# Patient Record
Sex: Female | Born: 1970 | Race: Black or African American | Hispanic: No | Marital: Married | Smoking: Current some day smoker
Health system: Southern US, Community
[De-identification: ages and names within clinical notes are randomized; demographics above are authoritative.]

## PROBLEM LIST (undated history)

## (undated) DIAGNOSIS — F439 Reaction to severe stress, unspecified: Secondary | ICD-10-CM

## (undated) HISTORY — PX: HIP SURGERY: SHX245

## (undated) HISTORY — PX: FEMUR SURGERY: SHX943

---

## 2015-03-23 DIAGNOSIS — F411 Generalized anxiety disorder: Secondary | ICD-10-CM | POA: Diagnosis present

## 2015-04-15 ENCOUNTER — Emergency Department (HOSPITAL_COMMUNITY)
Admission: EM | Admit: 2015-04-15 | Discharge: 2015-04-15 | Disposition: A | Discharge status: Home / Self Care | Payer: Medicaid Other | Attending: Emergency Medicine | Admitting: Emergency Medicine

## 2015-04-15 ENCOUNTER — Encounter (HOSPITAL_COMMUNITY): Payer: Self-pay | Admitting: Emergency Medicine

## 2015-04-15 DIAGNOSIS — Z3202 Encounter for pregnancy test, result negative: Secondary | ICD-10-CM | POA: Diagnosis not present

## 2015-04-15 DIAGNOSIS — N390 Urinary tract infection, site not specified: Secondary | ICD-10-CM

## 2015-04-15 DIAGNOSIS — R197 Diarrhea, unspecified: Secondary | ICD-10-CM | POA: Insufficient documentation

## 2015-04-15 DIAGNOSIS — Z79899 Other long term (current) drug therapy: Secondary | ICD-10-CM | POA: Insufficient documentation

## 2015-04-15 DIAGNOSIS — Z792 Long term (current) use of antibiotics: Secondary | ICD-10-CM | POA: Diagnosis not present

## 2015-04-15 DIAGNOSIS — R112 Nausea with vomiting, unspecified: Secondary | ICD-10-CM | POA: Diagnosis not present

## 2015-04-15 DIAGNOSIS — R52 Pain, unspecified: Secondary | ICD-10-CM | POA: Diagnosis present

## 2015-04-15 LAB — URINE MICROSCOPIC-ADD ON

## 2015-04-15 LAB — COMPREHENSIVE METABOLIC PANEL
ALBUMIN: 4.2 g/dL (ref 3.5–5.2)
ALK PHOS: 67 U/L (ref 39–117)
ALT: 33 U/L (ref 0–35)
AST: 42 U/L — AB (ref 0–37)
Anion gap: 9 (ref 5–15)
BILIRUBIN TOTAL: 0.8 mg/dL (ref 0.3–1.2)
BUN: 10 mg/dL (ref 6–23)
CO2: 26 mmol/L (ref 19–32)
Calcium: 9.4 mg/dL (ref 8.4–10.5)
Chloride: 102 mmol/L (ref 96–112)
Creatinine, Ser: 0.69 mg/dL (ref 0.50–1.10)
GFR calc Af Amer: >90 mL/min (ref >90)
GFR calc non Af Amer: >90 mL/min (ref >90)
Glucose, Bld: 99 mg/dL (ref 70–99)
POTASSIUM: 3.9 mmol/L (ref 3.5–5.1)
Sodium: 137 mmol/L (ref 135–145)
Total Protein: 7.9 g/dL (ref 6.0–8.3)

## 2015-04-15 LAB — CBC WITH DIFFERENTIAL/PLATELET
Basophils Absolute: 0 10*3/uL (ref 0.0–0.1)
Basophils Relative: 0 % (ref 0–1)
Eosinophils Absolute: 0.1 10*3/uL (ref 0.0–0.7)
Eosinophils Relative: 2 % (ref 0–5)
HCT: 34.1 % — ABNORMAL LOW (ref 36.0–46.0)
Hemoglobin: 11.2 g/dL — ABNORMAL LOW (ref 12.0–15.0)
LYMPHS PCT: 32 % (ref 12–46)
Lymphs Abs: 2.2 10*3/uL (ref 0.7–4.0)
MCH: 30.1 pg (ref 26.0–34.0)
MCHC: 32.8 g/dL (ref 30.0–36.0)
MCV: 91.7 fL (ref 78.0–100.0)
MONOS PCT: 10 % (ref 3–12)
Monocytes Absolute: 0.7 10*3/uL (ref 0.1–1.0)
NEUTROS ABS: 3.9 10*3/uL (ref 1.7–7.7)
NEUTROS PCT: 56 % (ref 43–77)
Platelets: 262 10*3/uL (ref 150–400)
RBC: 3.72 MIL/uL — ABNORMAL LOW (ref 3.87–5.11)
RDW: 13 % (ref 11.5–15.5)
WBC: 6.9 10*3/uL (ref 4.0–10.5)

## 2015-04-15 LAB — URINALYSIS, ROUTINE W REFLEX MICROSCOPIC
Bilirubin Urine: NEGATIVE
GLUCOSE, UA: NEGATIVE mg/dL
KETONES UR: NEGATIVE mg/dL
Nitrite: POSITIVE — AB
PH: 6 (ref 5.0–8.0)
Protein, ur: NEGATIVE mg/dL
SPECIFIC GRAVITY, URINE: 1.021 (ref 1.005–1.030)
Urobilinogen, UA: 1 mg/dL (ref 0.0–1.0)

## 2015-04-15 LAB — POC URINE PREG, ED: Preg Test, Ur: NEGATIVE

## 2015-04-15 LAB — LIPASE, BLOOD: Lipase: 24 U/L (ref 11–59)

## 2015-04-15 MED ORDER — SODIUM CHLORIDE 0.9 % IV BOLUS (SEPSIS)
1000.0000 mL | Freq: Once | INTRAVENOUS | Status: AC
  Administered 2015-04-15: 1000 mL via INTRAVENOUS

## 2015-04-15 MED ORDER — ONDANSETRON HCL 4 MG PO TABS: 4.0000 mg | ORAL_TABLET | Freq: Four times a day (QID) | ORAL | Status: DC

## 2015-04-15 MED ORDER — CEPHALEXIN 500 MG PO CAPS: 500.0000 mg | ORAL_CAPSULE | Freq: Four times a day (QID) | ORAL | Status: DC

## 2015-04-15 MED ORDER — ONDANSETRON HCL 4 MG/2ML IJ SOLN
4.0000 mg | Freq: Once | INTRAMUSCULAR | Status: AC
  Administered 2015-04-15: 4 mg via INTRAVENOUS
  Filled 2015-04-15: qty 2

## 2015-04-15 MED ORDER — DIPHENOXYLATE-ATROPINE 2.5-0.025 MG PO TABS: 2.0000 | ORAL_TABLET | Freq: Four times a day (QID) | ORAL | Status: DC | PRN

## 2015-04-15 NOTE — ED Notes (Signed)
Per pt- has been having generalized body aches and chills x2 weeks. Has been fatigued. Denies sob or chest pain. Pt alert and oriented x4.

## 2015-04-15 NOTE — ED Provider Notes (Signed)
CSN: [id number]     Arrival date & time 04/15/15  0806 History   First MD Initiated Contact with Patient 04/15/15 [phone]     Chief Complaint  Patient presents with  . Generalized Body Aches     (Consider location/radiation/quality/duration/timing/severity/associated sxs/prior Treatment) HPI Comments: Patient presents to the ER for evaluation of generalized myalgias with abdominal discomfort, nausea, vomiting and diarrhea. Patient reports that symptoms began 3 days ago. She reports that her discomfort worsened today. She has not been able to eat because every time she eats or drinks, she vomits. She has had a slight sore throat, no cough or shortness of breath. She does report chills and sweats, but has not had a fever.   History reviewed. No pertinent past medical history. History reviewed. No pertinent past surgical history. History reviewed. No pertinent family history. History  Substance Use Topics  . Smoking status: Never Smoker   . Smokeless tobacco: Not on file  . Alcohol Use: No   OB History    No data available     Review of Systems  Constitutional: Positive for chills.  Gastrointestinal: Positive for nausea, vomiting, abdominal pain and diarrhea.  All other systems reviewed and are negative.     Allergies  Review of patient's allergies indicates no known allergies.  Home Medications   Prior to Admission medications   Medication Sig Start Date End Date Taking? Authorizing Provider  acetaminophen (TYLENOL) 500 MG tablet Take 1,000 mg by mouth every 6 (six) hours as needed for fever.   Yes Historical Provider, MD  PRESCRIPTION MEDICATION Take 1 tablet by mouth at bedtime. Sleep medciation   Yes Historical Provider, MD  PRESCRIPTION MEDICATION Take 1 tablet by mouth at bedtime. Pain medication   Yes Historical Provider, MD  cephALEXin (KEFLEX) 500 MG capsule Take 1 capsule (500 mg total) by mouth 4 (four) times daily. 04/15/15   Gilda Creasehristopher J Taelyn Broecker, MD   diphenoxylate-atropine (LOMOTIL) 2.5-0.025 MG per tablet Take 2 tablets by mouth 4 (four) times daily as needed for diarrhea or loose stools. 04/15/15   Gilda Creasehristopher J Guerin Lashomb, MD  ondansetron (ZOFRAN) 4 MG tablet Take 1 tablet (4 mg total) by mouth every 6 (six) hours. 04/15/15   Gilda Creasehristopher J Maurya Nethery, MD   BP 104/65 mmHg  Pulse 69  Temp(Src) 98 F (36.7 C) (Oral)  Resp 20  Ht 5\' 6"  (1.676 m)  Wt 160 lb (72.576 kg)  BMI 25.84 kg/m2  SpO2 100%  LMP 03/26/2015 (Approximate) Physical Exam  Constitutional: She is oriented to person, place, and time. She appears well-developed and well-nourished. No distress.  HENT:  Head: Normocephalic and atraumatic.  Right Ear: Hearing normal.  Left Ear: Hearing normal.  Nose: Nose normal.  Mouth/Throat: Oropharynx is clear and moist and mucous membranes are normal.  Eyes: Conjunctivae and EOM are normal. Pupils are equal, round, and reactive to light.  Neck: Normal range of motion. Neck supple.  Cardiovascular: Regular rhythm, S1 normal and S2 normal.  Exam reveals no gallop and no friction rub.   No murmur heard. Pulmonary/Chest: Effort normal and breath sounds normal. No respiratory distress. She exhibits no tenderness.  Abdominal: Soft. Normal appearance and bowel sounds are normal. There is no hepatosplenomegaly. There is no tenderness. There is no rebound, no guarding, no tenderness at McBurney's point and negative Murphy's sign. No hernia.  Musculoskeletal: Normal range of motion.  Neurological: She is alert and oriented to person, place, and time. She has normal strength. No cranial nerve deficit or sensory  deficit. Coordination normal. GCS eye subscore is 4. GCS verbal subscore is 5. GCS motor subscore is 6.  Skin: Skin is warm, dry and intact. No rash noted. No cyanosis.  Psychiatric: She has a normal mood and affect. Her speech is normal and behavior is normal. Thought content normal.  Nursing note and vitals reviewed.   ED Course   Procedures (including critical care time) Labs Review Labs Reviewed  CBC WITH DIFFERENTIAL/PLATELET - Abnormal; Notable for the following:    RBC 3.72 (*)    Hemoglobin 11.2 (*)    HCT 34.1 (*)    All other components within normal limits  COMPREHENSIVE METABOLIC PANEL - Abnormal; Notable for the following:    AST 42 (*)    All other components within normal limits  URINALYSIS, ROUTINE W REFLEX MICROSCOPIC - Abnormal; Notable for the following:    Color, Urine AMBER (*)    APPearance CLOUDY (*)    Hgb urine dipstick SMALL (*)    Nitrite POSITIVE (*)    Leukocytes, UA SMALL (*)    All other components within normal limits  URINE MICROSCOPIC-ADD ON - Abnormal; Notable for the following:    Squamous Epithelial / LPF FEW (*)    Bacteria, UA MANY (*)    All other components within normal limits  LIPASE, BLOOD  POC URINE PREG, ED    Imaging Review No results found.   EKG Interpretation None      MDM   Final diagnoses:  UTI (lower urinary tract infection)  Nausea and vomiting, vomiting of unspecified type  Diarrhea    Patient presents to the ER for evaluation of generalized body aches. She reports feeling fatigued and all of her muscles hurting. She has had nausea, vomiting and diarrhea associated with the symptoms. Abdominal exam was benign. Vital signs are normal. Lab work was normal, other than signs of urinary tract infection on urinalysis. Patient was hydrated here in the ER. She is appropriate for outpatient management. Symptomatically treatment of vomiting and diarrhea with Lomotil and Zofran. She was prescribed Keflex for urinary tract infection.    Gilda Crease, MD 04/17/15 [phone]

## 2015-04-15 NOTE — Discharge Instructions (Signed)
Diarrhea °Diarrhea is watery poop (stool). It can make you feel weak, tired, thirsty, or give you a dry mouth (signs of dehydration). Watery poop is a sign of another problem, most often an infection. It often lasts 2-3 days. It can last longer if it is a sign of something serious. Take care of yourself as told by your doctor. °HOME CARE  °· Drink 1 cup (8 ounces) of fluid each time you have watery poop. °· Do not drink the following fluids: °· Those that contain simple sugars (fructose, glucose, galactose, lactose, sucrose, maltose). °· Sports drinks. °· Fruit juices. °· Whole milk products. °· Sodas. °· Drinks with caffeine (coffee, tea, soda) or alcohol. °· Oral rehydration solution may be used if the doctor says it is okay. You may make your own solution. Follow this recipe: °·  - teaspoon table salt. °· ¾ teaspoon baking soda. °·  teaspoon salt substitute containing potassium chloride. °· 1 tablespoons sugar. °· 1 liter (34 ounces) of water. °· Avoid the following foods: °· High fiber foods, such as raw fruits and vegetables. °· Nuts, seeds, and whole grain breads and cereals. °·  Those that are sweetened with sugar alcohols (xylitol, sorbitol, mannitol). °· Try eating the following foods: °· Starchy foods, such as rice, toast, pasta, low-sugar cereal, oatmeal, baked potatoes, crackers, and bagels. °· Bananas. °· Applesauce. °· Eat probiotic-rich foods, such as yogurt and milk products that are fermented. °· Wash your hands well after each time you have watery poop. °· Only take medicine as told by your doctor. °· Take a warm bath to help lessen burning or pain from having watery poop. °GET HELP RIGHT AWAY IF:  °· You cannot drink fluids without throwing up (vomiting). °· You keep throwing up. °· You have blood in your poop, or your poop looks black and tarry. °· You do not pee (urinate) in 6-8 hours, or there is only a small amount of very dark pee. °· You have belly (abdominal) pain that gets worse or stays  in the same spot (localizes). °· You are weak, dizzy, confused, or light-headed. °· You have a very bad headache. °· Your watery poop gets worse or does not get better. °· You have a fever or lasting symptoms for more than 2-3 days. °· You have a fever and your symptoms suddenly get worse. °MAKE SURE YOU:  °· Understand these instructions. °· Will watch your condition. °· Will get help right away if you are not doing well or get worse. °Document Released: 05/28/2008 Document Revised: 04/26/2014 Document Reviewed: 08/17/2012 °[employer]® Patient Information ©2015 [employer]. This information is not intended to replace advice given to you by your health care provider. Make sure you discuss any questions you have with your health care provider. ° °Nausea and Vomiting °Nausea means you feel sick to your stomach. Throwing up (vomiting) is a reflex where stomach contents come out of your mouth. °HOME CARE  °· Take medicine as told by your doctor. °· Do not force yourself to eat. However, you do need to drink fluids. °· If you feel like eating, eat a normal diet as told by your doctor. °· Eat rice, wheat, potatoes, bread, lean meats, yogurt, fruits, and vegetables. °· Avoid high-fat foods. °· Drink enough fluids to keep your pee (urine) clear or pale yellow. °· Ask your doctor how to replace body fluid losses (rehydrate). Signs of body fluid loss (dehydration) include: °· Feeling very thirsty. °· Dry lips and mouth. °· Feeling dizzy. °·   Dark pee. °· Peeing less than normal. °· Feeling confused. °· Fast breathing or heart rate. °GET HELP RIGHT AWAY IF:  °· You have blood in your throw up. °· You have black or bloody poop (stool). °· You have a bad headache or stiff neck. °· You feel confused. °· You have bad belly (abdominal) pain. °· You have chest pain or trouble breathing. °· You do not pee at least once every 8 hours. °· You have cold, clammy skin. °· You keep throwing up after 24 to 48 hours. °· You have a  fever. °MAKE SURE YOU:  °· Understand these instructions. °· Will watch your condition. °· Will get help right away if you are not doing well or get worse. °Document Released: 05/28/2008 Document Revised: 03/03/2012 Document Reviewed: 05/11/2011 °[employer]® Patient Information ©2015 [employer]. This information is not intended to replace advice given to you by your health care provider. Make sure you discuss any questions you have with your health care provider. ° °Urinary Tract Infection °A urinary tract infection (UTI) can occur any place along the urinary tract. The tract includes the kidneys, ureters, bladder, and urethra. A type of germ called bacteria often causes a UTI. UTIs are often helped with antibiotic medicine.  °HOME CARE  °· If given, take antibiotics as told by your doctor. Finish them even if you start to feel better. °· Drink enough fluids to keep your pee (urine) clear or pale yellow. °· Avoid tea, drinks with caffeine, and bubbly (carbonated) drinks. °· Pee often. Avoid holding your pee in for a long time. °· Pee before and after having sex (intercourse). °· Wipe from front to back after you poop (bowel movement) if you are a woman. Use each tissue only once. °GET HELP RIGHT AWAY IF:  °· You have back pain. °· You have lower belly (abdominal) pain. °· You have chills. °· You feel sick to your stomach (nauseous). °· You throw up (vomit). °· Your burning or discomfort with peeing does not go away. °· You have a fever. °· Your symptoms are not better in 3 days. °MAKE SURE YOU:  °· Understand these instructions. °· Will watch your condition. °· Will get help right away if you are not doing well or get worse. °Document Released: 05/28/2008 Document Revised: 09/03/2012 Document Reviewed: 07/10/2012 °[employer]® Patient Information ©2015 [employer]. This information is not intended to replace advice given to you by your health care provider. Make sure you discuss any questions you have with your  health care provider. ° °

## 2015-05-23 ENCOUNTER — Emergency Department (HOSPITAL_COMMUNITY)
Admission: EM | Admit: 2015-05-23 | Discharge: 2015-05-23 | Disposition: A | Discharge status: Home / Self Care | Payer: Medicaid Other | Attending: Emergency Medicine | Admitting: Emergency Medicine

## 2015-05-23 ENCOUNTER — Encounter (HOSPITAL_COMMUNITY): Payer: Self-pay | Admitting: *Deleted

## 2015-05-23 DIAGNOSIS — Z720 Tobacco use: Secondary | ICD-10-CM | POA: Insufficient documentation

## 2015-05-23 DIAGNOSIS — K029 Dental caries, unspecified: Secondary | ICD-10-CM | POA: Insufficient documentation

## 2015-05-23 DIAGNOSIS — Z792 Long term (current) use of antibiotics: Secondary | ICD-10-CM | POA: Insufficient documentation

## 2015-05-23 DIAGNOSIS — K0889 Other specified disorders of teeth and supporting structures: Secondary | ICD-10-CM

## 2015-05-23 DIAGNOSIS — K088 Other specified disorders of teeth and supporting structures: Secondary | ICD-10-CM | POA: Insufficient documentation

## 2015-05-23 DIAGNOSIS — F439 Reaction to severe stress, unspecified: Secondary | ICD-10-CM | POA: Insufficient documentation

## 2015-05-23 DIAGNOSIS — Z79899 Other long term (current) drug therapy: Secondary | ICD-10-CM | POA: Diagnosis not present

## 2015-05-23 HISTORY — DX: Reaction to severe stress, unspecified: F43.9

## 2015-05-23 MED ORDER — HYDROCODONE-ACETAMINOPHEN 5-325 MG PO TABS: 1.0000 | ORAL_TABLET | Freq: Four times a day (QID) | ORAL | Status: DC | PRN

## 2015-05-23 MED ORDER — IBUPROFEN 800 MG PO TABS: 800.0000 mg | ORAL_TABLET | Freq: Three times a day (TID) | ORAL | Status: DC | PRN

## 2015-05-23 MED ORDER — PENICILLIN V POTASSIUM 500 MG PO TABS: 500.0000 mg | ORAL_TABLET | Freq: Four times a day (QID) | ORAL | Status: AC

## 2015-05-23 NOTE — Discharge Instructions (Signed)
Read the information below.  Use the prescribed medication as directed.  Please discuss all new medications with your pharmacist.  Do not take additional tylenol while taking the prescribed pain medication to avoid overdose.  You may return to the Emergency Department at any time for worsening condition or any new symptoms that concern you.    If there is any possibility that you might be pregnant, please let your health care provider know and discuss this with the pharmacist to ensure medication safety.  Please call the dentist to schedule a close follow up appointment.  If you develop fevers, swelling in your face, difficulty swallowing or breathing, return to the ER immediately for a recheck.    Dental Pain A tooth ache may be caused by cavities (tooth decay). Cavities expose the nerve of the tooth to air and hot or cold temperatures. It may come from an infection or abscess (also called a boil or furuncle) around your tooth. It is also often caused by dental caries (tooth decay). This causes the pain you are having. DIAGNOSIS  Your caregiver can diagnose this problem by exam. TREATMENT   If caused by an infection, it may be treated with medications which kill germs (antibiotics) and pain medications as prescribed by your caregiver. Take medications as directed.  Only take over-the-counter or prescription medicines for pain, discomfort, or fever as directed by your caregiver.  Whether the tooth ache today is caused by infection or dental disease, you should see your dentist as soon as possible for further care. SEEK MEDICAL CARE IF: The exam and treatment you received today has been provided on an emergency basis only. This is not a substitute for complete medical or dental care. If your problem worsens or new problems (symptoms) appear, and you are unable to meet with your dentist, call or return to this location. SEEK IMMEDIATE MEDICAL CARE IF:   You have a fever.  You develop redness and  swelling of your face, jaw, or neck.  You are unable to open your mouth.  You have severe pain uncontrolled by pain medicine. MAKE SURE YOU:   Understand these instructions.  Will watch your condition.  Will get help right away if you are not doing well or get worse. Document Released: 12/10/2005 Document Revised: 03/03/2012 Document Reviewed: 07/28/2008 [facility] Patient Information 2015 [place], [place]. This information is not intended to replace advice given to you by your health care provider. Make sure you discuss any questions you have with your health care provider.    Emergency Department Resource Guide 1) Find a Doctor and Pay Out of Pocket Although you won't have to find out who is covered by your insurance plan, it is a good idea to ask around and get recommendations. You will then need to call the office and see if the doctor you have chosen will accept you as a new patient and what types of options they offer for patients who are self-pay. Some doctors offer discounts or will set up payment plans for their patients who do not have insurance, but you will need to ask so you aren't surprised when you get to your appointment.  2) Contact Your Local Health Department Not all health departments have doctors that can see patients for sick visits, but many do, so it is worth a call to see if yours does. If you don't know where your local health department is, you can check in your phone book. The CDC also has a tool to help  locate your state's health department, and many state websites also have listings of all of their local health departments. ° °3) Find a Walk-in Clinic °If your illness is not likely to be very severe or complicated, you may want to try a walk in clinic. These are popping up all over the country in pharmacies, drugstores, and shopping centers. They're usually staffed by nurse practitioners or physician assistants that have been trained to treat common illnesses and complaints.  They're usually fairly quick and inexpensive. However, if you have serious medical issues or chronic medical problems, these are probably not your best option. ° °No Primary Care Doctor: °- Call Health Connect at  832-8000 - they can help you locate a primary care doctor that  accepts your insurance, provides certain services, etc. °- Physician Referral Service- 1-800-533-3463 ° °Chronic Pain Problems: °Organization         Address  Phone   Notes  °[facility]  (336) 297-2271 Patients need to be referred by their primary care doctor.  ° °Medication Assistance: °Organization         Address  Phone   Notes  °[place] [facility] [address] °[place], [place] [address] (336) 641-8030 --Must be a resident of [place] °-- Must have NO insurance coverage whatsoever (no Medicaid/ Medicare, etc.) °-- The pt. MUST have a primary care doctor that directs their care regularly and follows them in the community °  °[facility]  (866) 331-1348   °United Way  (888) 892-1162   ° °Agencies that provide inexpensive medical care: °Organization         Address  Phone   Notes  °[facility]  (336) 832-8035   °[facility]    (336) 832-7272   °[facility] [address] °[place], [place] [address] (336) 832-4777   °[facility] [address], °[place] (336) 271-4999   °[facility]    (336) 373-0678   °[facility]    (336) 272-1050   °[facility] ° [address], [place] Phone:  (336) 832-4444, Fax:  (336) 832-4440 Hours of Operation:  9 am - 6 pm, M-F.  Also accepts Medicaid/Medicare and self-pay.  ° [facility] ° [address], [place] Phone: (336) 832-3150, Fax: (336) 832-3151. Hours of Operation:  8:30 am - 5:30 pm, M-F.  Also accepts Medicaid and self-pay.  °[facility] [place] [address], [place]  Phone: (336) 878-6027   °[facility] [address], [place], [place] (336)723-1848, Ext. 123 Mondays & Thursdays: 7-9 AM.  First 15 patients are seen on a first come, first serve basis. °  ° °Medicaid-accepting [place] Providers: ° °Organization         Address  Phone   Notes  °[facility] [address], [place] (336) 641-2100 Also accepts self-pay patients.  °[facility] [address], [place] ° (336) 856-9996   °[facility] [address], [place] (336) 288-8857   °[facility] [address] [place] [address], [place] (336) 299-7000   °Veita Bland [address], [place]  ° (336) 373-1557 Only accepts [place] Access Medicaid patients after they have their name applied to their card.  ° °Self-Pay (no insurance) in [place]: ° °Organization           Address  Phone   Notes  Sickle Cell Patients, [facility] [address], [place] [phone]) [phone]   [facility] [address], [place] [phone]) [phone]   [facility] [place]  [address], [place] [phone]) [phone]   [facility]/Dr. Osei-Bonsu  [address] [place] [address], [place] or [phone], [place] [phone]) [phone] Phone number for both [place] and [place] locations is the same.  [facility] [address], [place] [phone]   [facility] [address] [place] [address], [place] or [address] [phone]   [facility] [address], [place] [phone], phone; [phone], fax Sees patients 1st and 3rd Saturday of every month.  Must not qualify for public or private insurance (i.e. Medicaid, Medicare, [place] Health Choice, Veterans' Benefits)  Household income should be no more than 200% of the poverty level  The clinic cannot treat you if you are pregnant or think you are pregnant  Sexually transmitted diseases are not treated at the clinic.    Dental Care: Organization         Address  Phone  Notes  [facility] [address], [place] [phone] Accepts children up to age 44 who are enrolled in [place] or [place] Health Choice; pregnant women with a Medicaid card; and children who have applied for Medicaid or [place] Health Choice, but were declined, whose parents can pay a reduced fee at time of service.  [facility] [place]  [address], [place] [phone](336) [phone] Accepts children up to age 44 who are enrolled in [place] or  Health Choice; pregnant women with a Medicaid card; and children who have applied for Medicaid or  Health Choice, but were declined, whose parents can pay a reduced fee at time of service.  [facility]  [address], [place] [phone]) [phone] Patients are seen by appointment only. Walk-ins are not accepted. [facility] will see patients 44 years of age and older. Monday - Tuesday (8am-5pm) Most Wednesdays (8:30-5pm) $30 per visit, cash only  [facility]  [address], [facility] [phone]) [phone] Patients are seen by appointment only. Walk-ins are not accepted. [facility] will see patients 44 years of age and older. One Wednesday Evening (Monthly: Volunteer Based).  $30 per visit, cash only  [facility]  [phone] for adults; Children under age 294, call Graduate Pediatric Dentistry at [phone]. Children aged 704-14, please call [phone] to request a pediatric application.  Dental services are provided in all areas of dental care including fillings, crowns and bridges, complete and partial dentures, implants, gum treatment, root canals, and extractions. Preventive care is  also provided. Treatment is provided to both adults and children. Patients are selected via a lottery and there is often a waiting list.   [facility] [address], [place]  [phone] [email or web address]   [facility] [address], [place], [place] [phone], Ext. [phone] Second and Fourth Thursday of each month, opens at 6:30 AM; Clinic ends at 9 AM.  Patients are seen on a first-come first-served basis, and a limited number are seen during each clinic.   [facility]  [address], [place], [place] [phone]) [phone]   Eligibility Requirements °You must have lived in [place], [place], or [place] counties for at least the last three months. °  You cannot be eligible for state or federal sponsored healthcare insurance, including Veterans Administration, Medicaid, or Medicare. °  You generally cannot be eligible for healthcare insurance through your employer.  °  How to apply: °Eligibility screenings are held every Tuesday and Wednesday afternoon from 1:00 pm until 4:00 pm. You do not need an appointment for the interview!  °[facility] [address], [place], [place] [phone]   °[place] [facility]  336-342-8273   °[facility]  336-703-3100   °[facility]  336-570-6415   ° °Behavioral Health Resources in the Community: °Intensive Outpatient Programs °Organization         Address  Phone  Notes  °[facility] [place] [facility] [address], [place], [place] 336-878-6098   °[facility] [address], [place], [place] 336-832-9800   °[facility] [address], [place], [place] ° 336-882-2125   °[facility] [address],  °[place], [place] 1-800-853-5163 or 336-641-4981   °Substance Abuse Resources °Organization         Address  Phone  Notes  °[facility]  336-882-2125   °[facility]   336-784-9470   °[facility]  336-285-9073   °[facility]  336-845-3988   °Residential & Outpatient Substance Abuse Program  1-800-659-3381   °Psychological Services °Organization         Address  Phone  Notes  ° Health  336- 832-9600   °[facility]  336- 378-7881   °[facility] [address], [place] 1-800-853-5163 or 336-641-4981   ° °Mobile Crisis Teams °Organization         Address  Phone  Notes  °[facility], Mobile Crisis Care Unit  1-877-626-1772   °Assertive °[facility] ° [address] [place], [place] 336-834-9664   °Sharon DeEsch [address] °[place] 336-554-5454   ° °Self-Help/Support Groups °Organization         Address  Phone             Notes  °[facility] [place] - variety of support groups  336- 373-1402 Call for more information  °Narcotics Anonymous (NA), [facility] [address], °[place]  2 meetings at this location  ° °Residential Treatment Programs °Organization         Address  Phone  Notes  °[facility] [address],    °[place]  1-866-801-8205   °[facility] ° [address], [place], [place] 704-293-8524   °[facility] [address], [place] 336-845-3988 Admissions: 8am-3pm M-F  °[facility] [address]    °[place], [place] 336-841-1104   °[facility] [address], [place], [place] 336-379-7146   °[facility] [address]  °[place], [place] 336-285-9073   °[facility] Intensive Outpatient [address], [place], [place] 336-852-3033   °[facility] [address]  °[place], [place] 1-877-615-2722 or 336-784-9470   °[facility] ([facility]) [address] [place], [place] 336-227-7417 Accepts Medicaid  °[facility] [address]  °[place]  1-800-659-3381 Substance  Abuse/Addiction Treatment  ° °[place] Behavioral Health Resources °Organization           Address  Phone  Notes  °[facility]  (888) 581-9988   °Julie Brannon, PhD [address] [place], [place]   (336) 349-5553 or (336) 951-0000   °[facility]   [address] °[place], [place] (336) 349-4454   °[facility] [address], [place], [place] (336) 342-8316 Insurance/Medicaid/sponsorship through [facility]  °[facility] [address]                                    [place], [place] (336) 342-8316 Therapy/tele-psych/case  °[facility] [address]  ° [place], [place] (336) 349-2233    °Dr. Arfeen  (336) 349-4544   °[facility] [place]  [employer] [place] [facility] 1) [address], [place] °2) [address], [place] °3)  [address], [place] (336) 349-3220 °(336) 342-7768 ° °(336) 342-8140   °[place] Child Abuse Hotline (336) 342-1394 or (336) 342-3537 (After Hours)    ° ° ° °

## 2015-05-23 NOTE — ED Notes (Signed)
Pt reports dental pain due broken tooth. Pt needs a referral to dentist.

## 2015-05-23 NOTE — ED Provider Notes (Signed)
CSN: [id number]     Arrival date & time 05/23/15  1026 History  This chart was scribed for non-physician practitioner, Trixie Dredge, PA-C, working with Mancel Bale, MD by Charline Bills, ED Scribe. This patient was seen in room TR06C/TR06C and the patient's care was started at 10:52 AM.   Chief Complaint  Patient presents with  . Dental Pain   The history is provided by the patient. No language interpreter was used.   HPI Comments: Erica Wiggins is a 44 y.o. female who presents to the Emergency Department complaining of constant, moderate right upper dental pain for the past 3 days. Pt states that she was eating rock candy 3 days ago when she chewed on a portion of her tooth. Pt describes her pain as a throbbing sensation that is exacerbated with eating, drinking and cool air. She has been treating with extra strength Tylenol and topical ointment without relief. She denies fever, chills, sore throat. Pt is in the [employer] and recently moved here after residing in [place] and [place]; she does not have a Radiation protection practitioner.   Past Medical History  Diagnosis Date  . Stress    Past Surgical History  Procedure Laterality Date  . Hip surgery      occured while in service  . Femur surgery      while in the service   History reviewed. No pertinent family history. History  Substance Use Topics  . Smoking status: Current Some Day Smoker    Types: Cigarettes  . Smokeless tobacco: Never Used  . Alcohol Use: No   OB History    No data available     Review of Systems  Constitutional: Negative for fever and chills.  HENT: Positive for dental problem. Negative for sore throat.    Allergies  Review of patient's allergies indicates no known allergies.  Home Medications   Prior to Admission medications   Medication Sig Start Date End Date Taking? Authorizing Provider  acetaminophen (TYLENOL) 500 MG tablet Take 1,000 mg by mouth every 6 (six) hours as needed for fever.    Historical Provider, MD   cephALEXin (KEFLEX) 500 MG capsule Take 1 capsule (500 mg total) by mouth 4 (four) times daily. 04/15/15   Gilda Crease, MD  diphenoxylate-atropine (LOMOTIL) 2.5-0.025 MG per tablet Take 2 tablets by mouth 4 (four) times daily as needed for diarrhea or loose stools. 04/15/15   Gilda Crease, MD  ondansetron (ZOFRAN) 4 MG tablet Take 1 tablet (4 mg total) by mouth every 6 (six) hours. 04/15/15   Gilda Crease, MD  PRESCRIPTION MEDICATION Take 1 tablet by mouth at bedtime. Sleep medciation    Historical Provider, MD  PRESCRIPTION MEDICATION Take 1 tablet by mouth at bedtime. Pain medication    Historical Provider, MD   Triage: BP 108/72 mmHg  Pulse 88  Temp(Src) 98.7 F (37.1 C) (Oral)  Resp 16  SpO2 100%  LMP 04/26/2015 Physical Exam  Constitutional: She appears well-developed and well-nourished. No distress.  HENT:  Head: Normocephalic and atraumatic.  Mouth/Throat: Uvula is midline and oropharynx is clear and moist. Mucous membranes are not dry. Dental caries present. No uvula swelling. No oropharyngeal exudate, posterior oropharyngeal edema, posterior oropharyngeal erythema or tonsillar abscesses.  Right upper first molar with large cavity with decay and broken metal filling.  No edema, erythema, fluctuance, facial swelling.    Neck: Trachea normal, normal range of motion and phonation normal. Neck supple. No tracheal tenderness present. No rigidity. No tracheal deviation,  no edema, no erythema and normal range of motion present.  Cardiovascular: Normal rate.   Pulmonary/Chest: Effort normal and breath sounds normal. No stridor.  Lymphadenopathy:    She has no cervical adenopathy.  Neurological: She is alert.  Skin: She is not diaphoretic.  Nursing note and vitals reviewed.  ED Course  Procedures (including critical care time) DIAGNOSTIC STUDIES: Oxygen Saturation is 100% on RA, normal by my interpretation.    COORDINATION OF CARE: 10:57 AM-Discussed  treatment plan which includes 800 mg ibuprofen, Norco, Veetid and follow-up with dentist with pt at bedside and pt agreed to plan.   Labs Review Labs Reviewed - No data to display  Imaging Review No results found.   EKG Interpretation None      MDM   Final diagnoses:  Pain, dental    Afebrile, nontoxic patient with new dental pain.  Large cavity with decay in previously filled molar. No obvious abscess.  No concerning findings on exam.  Doubt deep space head or neck infection.  Doubt Ludwig's angina.  D/C home with antibiotic, pain medication and dental follow up.  Discussed findings, treatment, and follow up  with patient.  Pt given return precautions.  Pt verbalizes understanding and agrees with plan.       I personally performed the services described in this documentation, which was scribed in my presence. The recorded information has been reviewed and is accurate.    Trixie Dredgemily Kirstin Kugler, PA-C 05/23/15 1427  Mancel BaleElliott [place], MD 05/23/15 [phone]

## 2015-05-28 ENCOUNTER — Emergency Department (HOSPITAL_COMMUNITY)
Admission: EM | Admit: 2015-05-28 | Discharge: 2015-05-28 | Disposition: A | Discharge status: Home / Self Care | Payer: Medicaid Other | Attending: Emergency Medicine | Admitting: Emergency Medicine

## 2015-05-28 ENCOUNTER — Encounter (HOSPITAL_COMMUNITY): Payer: Self-pay | Admitting: *Deleted

## 2015-05-28 DIAGNOSIS — Z720 Tobacco use: Secondary | ICD-10-CM | POA: Diagnosis not present

## 2015-05-28 DIAGNOSIS — R1110 Vomiting, unspecified: Secondary | ICD-10-CM | POA: Insufficient documentation

## 2015-05-28 DIAGNOSIS — Z792 Long term (current) use of antibiotics: Secondary | ICD-10-CM | POA: Insufficient documentation

## 2015-05-28 DIAGNOSIS — Z79899 Other long term (current) drug therapy: Secondary | ICD-10-CM | POA: Insufficient documentation

## 2015-05-28 DIAGNOSIS — J029 Acute pharyngitis, unspecified: Secondary | ICD-10-CM

## 2015-05-28 LAB — COMPREHENSIVE METABOLIC PANEL
ALBUMIN: 4.2 g/dL (ref 3.5–5.0)
ALK PHOS: 94 U/L (ref 38–126)
ALT: 68 U/L — ABNORMAL HIGH (ref 14–54)
AST: 86 U/L — AB (ref 15–41)
Anion gap: 15 (ref 5–15)
BUN: 14 mg/dL (ref 6–20)
CO2: 19 mmol/L — ABNORMAL LOW (ref 22–32)
CREATININE: 0.82 mg/dL (ref 0.44–1.00)
Calcium: 9.2 mg/dL (ref 8.9–10.3)
Chloride: 103 mmol/L (ref 101–111)
GFR calc Af Amer: >60 mL/min (ref >60)
GFR calc non Af Amer: >60 mL/min (ref >60)
Glucose, Bld: 116 mg/dL — ABNORMAL HIGH (ref 65–99)
Potassium: 3.7 mmol/L (ref 3.5–5.1)
SODIUM: 137 mmol/L (ref 135–145)
Total Bilirubin: 0.7 mg/dL (ref 0.3–1.2)
Total Protein: 7.9 g/dL (ref 6.5–8.1)

## 2015-05-28 LAB — CBC WITH DIFFERENTIAL/PLATELET
Basophils Absolute: 0 10*3/uL (ref 0.0–0.1)
Basophils Relative: 0 % (ref 0–1)
EOS PCT: 1 % (ref 0–5)
Eosinophils Absolute: 0.1 10*3/uL (ref 0.0–0.7)
HCT: 32.1 % — ABNORMAL LOW (ref 36.0–46.0)
Hemoglobin: 10.8 g/dL — ABNORMAL LOW (ref 12.0–15.0)
Lymphocytes Relative: 29 % (ref 12–46)
Lymphs Abs: 2.8 10*3/uL (ref 0.7–4.0)
MCH: 30.2 pg (ref 26.0–34.0)
MCHC: 33.6 g/dL (ref 30.0–36.0)
MCV: 89.7 fL (ref 78.0–100.0)
MONOS PCT: 5 % (ref 3–12)
Monocytes Absolute: 0.5 10*3/uL (ref 0.1–1.0)
NEUTROS ABS: 6.3 10*3/uL (ref 1.7–7.7)
Neutrophils Relative %: 65 % (ref 43–77)
Platelets: 264 10*3/uL (ref 150–400)
RBC: 3.58 MIL/uL — AB (ref 3.87–5.11)
RDW: 13.7 % (ref 11.5–15.5)
WBC: 9.8 10*3/uL (ref 4.0–10.5)

## 2015-05-28 LAB — LIPASE, BLOOD: LIPASE: 18 U/L — AB (ref 22–51)

## 2015-05-28 MED ORDER — IBUPROFEN 600 MG PO TABS: 600.0000 mg | ORAL_TABLET | Freq: Four times a day (QID) | ORAL | Status: DC | PRN

## 2015-05-28 NOTE — ED Provider Notes (Signed)
CSN: [id number]     Arrival date & time 05/28/15  1515 History   First MD Initiated Contact with Patient 05/28/15 1652     Chief Complaint  Patient presents with  . Sore Throat  . Emesis     (Consider location/radiation/quality/duration/timing/severity/associated sxs/prior Treatment) HPI Erica Wiggins is a 44 y.o. female who comes in for evaluation of sore throat, nausea and vomiting. Patient states she was recently seen a few days ago in the ED for a toothache. She was prescribed antibiotic's and has been taking this medication without missing a dose. Reports she has 3 days left of this medication. She reports onset of sore throat today with some nausea. She reports eating a brownie, cheese and a hamburger at lunch and proceeded to vomit. She reports feeling hot and cold at home but denies any measurable fever. Denies difficulties breathing, eating or drinking. She is tried Tylenol without relief of her symptoms. Rates her pain as a 9/10. No other aggravating or modifying factors.  Past Medical History  Diagnosis Date  . Stress    Past Surgical History  Procedure Laterality Date  . Hip surgery      occured while in service  . Femur surgery      while in the service   History reviewed. No pertinent family history. History  Substance Use Topics  . Smoking status: Current Some Day Smoker    Types: Cigarettes  . Smokeless tobacco: Never Used  . Alcohol Use: No   OB History    No data available     Review of Systems A 10 point review of systems was completed and was negative except for pertinent positives and negatives as mentioned in the history of present illness     Allergies  Review of patient's allergies indicates no known allergies.  Home Medications   Prior to Admission medications   Medication Sig Start Date End Date Taking? Authorizing Provider  acetaminophen (TYLENOL) 500 MG tablet Take 1,000 mg by mouth every 6 (six) hours as needed for fever.   Yes Historical  Provider, MD  diphenoxylate-atropine (LOMOTIL) 2.5-0.025 MG per tablet Take 2 tablets by mouth 4 (four) times daily as needed for diarrhea or loose stools. 04/15/15  Yes Gilda Creasehristopher J Pollina, MD  ferrous sulfate 325 (65 FE) MG tablet Take 325 mg by mouth daily. 02/21/15 02/21/16 Yes Historical Provider, MD  penicillin v potassium (VEETID) 500 MG tablet Take 1 tablet (500 mg total) by mouth 4 (four) times daily. 05/23/15 05/30/15 Yes Trixie DredgeEmily West, PA-C  cephALEXin (KEFLEX) 500 MG capsule Take 1 capsule (500 mg total) by mouth 4 (four) times daily. 04/15/15   Gilda Creasehristopher J Pollina, MD  HYDROcodone-acetaminophen (NORCO/VICODIN) 5-325 MG per tablet Take 1 tablet by mouth every 6 (six) hours as needed for severe pain. 05/23/15   Trixie DredgeEmily West, PA-C  ibuprofen (ADVIL,MOTRIN) 600 MG tablet Take 1 tablet (600 mg total) by mouth every 6 (six) hours as needed. 05/28/15   Joycie PeekBenjamin Marvel Sapp, PA-C  ondansetron (ZOFRAN) 4 MG tablet Take 1 tablet (4 mg total) by mouth every 6 (six) hours. 04/15/15   Gilda Creasehristopher J Pollina, MD  PRESCRIPTION MEDICATION Take 1 tablet by mouth at bedtime. Sleep medciation    Historical Provider, MD  PRESCRIPTION MEDICATION Take 1 tablet by mouth at bedtime. Pain medication    Historical Provider, MD   BP 115/76 mmHg  Pulse 91  Temp(Src) 98.8 F (37.1 C) (Oral)  Resp 18  Ht 5\' 5"  (1.651 m)  Wt 160 lb (  72.576 kg)  BMI 26.63 kg/m2  SpO2 96%  LMP 05/25/2015 Physical Exam  Constitutional: She is oriented to person, place, and time. She appears well-developed and well-nourished.  HENT:  Head: Normocephalic and atraumatic.  Mouth/Throat: Oropharynx is clear and moist.  Small exudates on bilateral tonsils. No unilateral tonsillar swelling. Patent airway. No trismus or glossal elevation. Tolerating secretions well.  Eyes: Conjunctivae are normal. Pupils are equal, round, and reactive to light. Right eye exhibits no discharge. Left eye exhibits no discharge. No scleral icterus.  Neck: Normal range of  motion. Neck supple.  Very mild left-sided cervical adenopathy  Cardiovascular: Normal rate, regular rhythm and normal heart sounds.   Pulmonary/Chest: Effort normal and breath sounds normal. No respiratory distress. She has no wheezes. She has no rales.  Abdominal: Soft. There is no tenderness.  Musculoskeletal: She exhibits no tenderness.  Neurological: She is alert and oriented to person, place, and time.  Cranial Nerves II-XII grossly intact  Skin: Skin is warm and dry. No rash noted.  Psychiatric: She has a normal mood and affect.  Nursing note and vitals reviewed.   ED Course  Procedures (including critical care time) Labs Review Labs Reviewed  CBC WITH DIFFERENTIAL/PLATELET - Abnormal; Notable for the following:    RBC 3.58 (*)    Hemoglobin 10.8 (*)    HCT 32.1 (*)    All other components within normal limits  COMPREHENSIVE METABOLIC PANEL - Abnormal; Notable for the following:    CO2 19 (*)    Glucose, Bld 116 (*)    AST 86 (*)    ALT 68 (*)    All other components within normal limits  LIPASE, BLOOD - Abnormal; Notable for the following:    Lipase 18 (*)    All other components within normal limits    Imaging Review No results found.   EKG Interpretation None     Meds given in ED:  Medications - No data to display  Discharge Medication List as of 05/28/2015  5:57 PM     Filed Vitals:   05/28/15 1531 05/28/15 1735 05/28/15 1823  BP: 117/80 114/72 115/76  Pulse: 103 89 91  Temp: 98.7 F (37.1 C)  98.8 F (37.1 C)  TempSrc: Oral  Oral  Resp:  20 18  Height:  (1.651 m)    Weight: 160 lb (72.576 kg)    SpO2: 95% 99% 96%    MDM  Vitals stable - WNL -afebrile Pt resting comfortably in ED. PE--physical exam is grossly benign. No evidence of deep space infection. Doubt Ludwig angina. No evidence of retropharyngeal or peritonsillar abscess. Labwork--labs are noncontributory.  Patient with sore throat likely secondary to viral etiology. No evidence  of acute bacterial process. Patient is on penicillin now for dental infection. Encouraged her to continue antibiotic therapy and to follow-up with her dentist as previously recommended. I discussed all relevant lab findings and imaging results with pt and they verbalized understanding. Discussed f/u with PCP within 48 hrs and return precautions, pt very amenable to plan.  Final diagnoses:  Sore throat       Joycie Peek, PA-C 05/30/15 0028  Glynn Octave, MD 05/30/15 [phone]

## 2015-05-28 NOTE — Discharge Instructions (Signed)
Salt Water Gargle °This solution will help make your mouth and throat feel better. °HOME CARE INSTRUCTIONS  °· Mix 1 teaspoon of salt in 8 ounces of warm water. °· Gargle with this solution as much or often as you need or as directed. Swish and gargle gently if you have any sores or wounds in your mouth. °· Do not swallow this mixture. °Document Released: 09/13/2004 Document Revised: 03/03/2012 Document Reviewed: 02/04/2009 °ExitCare® Patient Information ©2015 [employer]. This information is not intended to replace advice given to you by your health care provider. Make sure you discuss any questions you have with your health care provider. ° °Pharyngitis °Pharyngitis is a sore throat (pharynx). There is redness, pain, and swelling of your throat. °HOME CARE  °· Drink enough fluids to keep your pee (urine) clear or pale yellow. °· Only take medicine as told by your doctor. °¨ You may get sick again if you do not take medicine as told. Finish your medicines, even if you start to feel better. °¨ Do not take aspirin. °· Rest. °· Rinse your mouth (gargle) with salt water (½ tsp of salt per 1 qt of water) every 1-2 hours. This will help the pain. °· If you are not at risk for choking, you can suck on hard candy or sore throat lozenges. °GET HELP IF: °· You have large, tender lumps on your neck. °· You have a rash. °· You cough up green, yellow-brown, or bloody spit. °GET HELP RIGHT AWAY IF:  °· You have a stiff neck. °· You drool or cannot swallow liquids. °· You throw up (vomit) or are not able to keep medicine or liquids down. °· You have very bad pain that does not go away with medicine. °· You have problems breathing (not from a stuffy nose). °MAKE SURE YOU:  °· Understand these instructions. °· Will watch your condition. °· Will get help right away if you are not doing well or get worse. °Document Released: 05/28/2008 Document Revised: 09/30/2013 Document Reviewed: 08/17/2013 °ExitCare® Patient Information ©2015  [employer]. This information is not intended to replace advice given to you by your health care provider. Make sure you discuss any questions you have with your health care provider. ° °

## 2015-05-28 NOTE — ED Notes (Addendum)
Pt reports onset today of body pain, sore throat, headache, chills, n/v/d.

## 2015-08-08 ENCOUNTER — Encounter (HOSPITAL_COMMUNITY): Payer: Self-pay | Admitting: Family Medicine

## 2015-08-08 ENCOUNTER — Emergency Department (HOSPITAL_COMMUNITY)
Admission: EM | Admit: 2015-08-08 | Discharge: 2015-08-08 | Discharge status: Against Medical Advice | Payer: Medicaid Other | Attending: Emergency Medicine | Admitting: Emergency Medicine

## 2015-08-08 DIAGNOSIS — R079 Chest pain, unspecified: Secondary | ICD-10-CM | POA: Insufficient documentation

## 2015-08-08 DIAGNOSIS — Z720 Tobacco use: Secondary | ICD-10-CM | POA: Diagnosis not present

## 2015-08-08 DIAGNOSIS — R0602 Shortness of breath: Secondary | ICD-10-CM | POA: Diagnosis not present

## 2015-08-08 LAB — BASIC METABOLIC PANEL
ANION GAP: 13 (ref 5–15)
BUN: 10 mg/dL (ref 6–20)
CALCIUM: 9.2 mg/dL (ref 8.9–10.3)
CO2: 21 mmol/L — AB (ref 22–32)
Chloride: 104 mmol/L (ref 101–111)
Creatinine, Ser: 0.73 mg/dL (ref 0.44–1.00)
Glucose, Bld: 84 mg/dL (ref 65–99)
Potassium: 4 mmol/L (ref 3.5–5.1)
SODIUM: 138 mmol/L (ref 135–145)

## 2015-08-08 LAB — CBC
HCT: 35.5 % — ABNORMAL LOW (ref 36.0–46.0)
HEMOGLOBIN: 11.9 g/dL — AB (ref 12.0–15.0)
MCH: 32.2 pg (ref 26.0–34.0)
MCHC: 33.5 g/dL (ref 30.0–36.0)
MCV: 96.2 fL (ref 78.0–100.0)
PLATELETS: 215 10*3/uL (ref 150–400)
RBC: 3.69 MIL/uL — AB (ref 3.87–5.11)
RDW: 14.4 % (ref 11.5–15.5)
WBC: 5.5 10*3/uL (ref 4.0–10.5)

## 2015-08-08 LAB — I-STAT TROPONIN, ED: TROPONIN I, POC: 0 ng/mL (ref 0.00–0.08)

## 2015-08-08 NOTE — ED Notes (Signed)
Pt called again for 3rd time in main ED waiting area with no response

## 2015-08-08 NOTE — ED Notes (Signed)
Pt here for intermittent chest pain since yesterday that is sharp stabbing. sts SOB

## 2015-08-08 NOTE — ED Notes (Signed)
Pt called in main ED waiting area with no response; checked outside and pt was nowhere to be found

## 2015-08-08 NOTE — ED Notes (Signed)
Xray came to get patient for testing; pt still not in main ED waiting area

## 2016-08-16 ENCOUNTER — Encounter: Payer: Self-pay | Admitting: Emergency Medicine

## 2016-08-16 ENCOUNTER — Emergency Department
Admission: EM | Admit: 2016-08-16 | Discharge: 2016-08-16 | Disposition: A | Discharge status: Home / Self Care | Payer: MEDICAID | Attending: Emergency Medicine | Admitting: Emergency Medicine

## 2016-08-16 DIAGNOSIS — G8929 Other chronic pain: Secondary | ICD-10-CM | POA: Insufficient documentation

## 2016-08-16 DIAGNOSIS — F4310 Post-traumatic stress disorder, unspecified: Secondary | ICD-10-CM | POA: Insufficient documentation

## 2016-08-16 DIAGNOSIS — M79662 Pain in left lower leg: Secondary | ICD-10-CM | POA: Diagnosis present

## 2016-08-16 DIAGNOSIS — Z791 Long term (current) use of non-steroidal anti-inflammatories (NSAID): Secondary | ICD-10-CM | POA: Diagnosis not present

## 2016-08-16 DIAGNOSIS — F17210 Nicotine dependence, cigarettes, uncomplicated: Secondary | ICD-10-CM | POA: Insufficient documentation

## 2016-08-16 MED ORDER — DULOXETINE HCL 60 MG PO CPEP
60.0000 mg | ORAL_CAPSULE | Freq: Every day | ORAL | 0 refills | Status: AC
Start: 2016-08-16 — End: 2022-08-05

## 2016-08-16 MED ORDER — TRAZODONE HCL 50 MG PO TABS: 50.0000 mg | ORAL_TABLET | Freq: Every day | ORAL | 0 refills | Status: DC

## 2016-08-16 NOTE — ED Notes (Signed)
states she has not been feeling well for about 2 weeks.. Denies any fever or urinary sxs' having pain to left leg and right eye. States she is active [employer] and not [facility]
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
## 2016-08-16 NOTE — Discharge Instructions (Signed)
Follow-up with your provider for routine medical care and pain management. You should take the prescription Trazadone and Cymbalta as directed for sleep and mood. Continue OTC pain medicine as needed.

## 2016-08-16 NOTE — ED Notes (Signed)
NAD noted at time of D/C. This RN offered patient a wheelchair to the lobby, pt states "No I will walk out with my husband". Pt's husband states "she needs something for pain", this RN explained to patient that she needed to follow up with prior referral to pain management per PA. Pt states understanding. Pt denies further comments/concerns. Pt ambulatory to the lobby at this time.

## 2016-08-16 NOTE — ED Triage Notes (Signed)
Pt presents with body aches for two weeks.

## 2017-04-08 ENCOUNTER — Encounter: Payer: Self-pay | Admitting: Emergency Medicine

## 2017-04-08 ENCOUNTER — Emergency Department
Admission: EM | Admit: 2017-04-08 | Discharge: 2017-04-08 | Disposition: A | Discharge status: Home / Self Care | Payer: MEDICAID | Attending: Emergency Medicine | Admitting: Emergency Medicine

## 2017-04-08 DIAGNOSIS — Z79899 Other long term (current) drug therapy: Secondary | ICD-10-CM | POA: Insufficient documentation

## 2017-04-08 DIAGNOSIS — F17210 Nicotine dependence, cigarettes, uncomplicated: Secondary | ICD-10-CM | POA: Insufficient documentation

## 2017-04-08 DIAGNOSIS — K0889 Other specified disorders of teeth and supporting structures: Secondary | ICD-10-CM

## 2017-04-08 DIAGNOSIS — N611 Abscess of the breast and nipple: Secondary | ICD-10-CM

## 2017-04-08 MED ORDER — HYDROCODONE-ACETAMINOPHEN 5-325 MG PO TABS: 1.0000 | ORAL_TABLET | ORAL | 0 refills | Status: DC | PRN

## 2017-04-08 MED ORDER — HYDROCODONE-ACETAMINOPHEN 5-325 MG PO TABS: 1.0000 | ORAL_TABLET | ORAL | 0 refills | Status: AC | PRN

## 2017-04-08 MED ORDER — SULFAMETHOXAZOLE-TRIMETHOPRIM 800-160 MG PO TABS: 1.0000 | ORAL_TABLET | Freq: Two times a day (BID) | ORAL | 0 refills | Status: DC

## 2017-04-08 MED ORDER — LIDOCAINE HCL (PF) 1 % IJ SOLN
INTRAMUSCULAR | Status: AC
  Filled 2017-04-08: qty 5

## 2017-04-08 NOTE — ED Provider Notes (Signed)
[facility] Emergency Department Provider Note  ____________________________________________   First MD Initiated Contact with Patient 04/08/17 1241     (approximate)  I have reviewed the triage vital signs and the nursing notes.   HISTORY  Chief Complaint Breast Pain and Dental Pain   HPI Erica Wiggins is a 46 y.o. female who presents to the emergency department for evaluation of an abscess under her left breast as well as dental pain. All of her symptoms started 2-3 days ago. She has a history of a skin infection that required I&D a few years ago, but is not sure if it was staph or MRSA. She has taken ibuprofen without relief.   Past Medical History:  Diagnosis Date  . Stress     Patient Active Problem List   Diagnosis Date Noted  . Stress     Past Surgical History:  Procedure Laterality Date  . FEMUR SURGERY     while in the service  . HIP SURGERY     occured while in service    Prior to Admission medications   Medication Sig Start Date End Date Taking? Authorizing Provider  acetaminophen (TYLENOL) 500 MG tablet Take 1,000 mg by mouth every 6 (six) hours as needed for fever.    Historical Provider, MD  cephALEXin (KEFLEX) 500 MG capsule Take 1 capsule (500 mg total) by mouth 4 (four) times daily. 04/15/15   Gilda Crease, MD  diphenoxylate-atropine (LOMOTIL) 2.5-0.025 MG per tablet Take 2 tablets by mouth 4 (four) times daily as needed for diarrhea or loose stools. 04/15/15   Gilda Crease, MD  DULoxetine (CYMBALTA) 60 MG capsule Take 1 capsule (60 mg total) by mouth daily. 08/16/16 08/16/17  Charlesetta Ivory Menshew, PA-C  ferrous sulfate 325 (65 FE) MG tablet Take 325 mg by mouth daily. 02/21/15 02/21/16  Historical Provider, MD  HYDROcodone-acetaminophen (NORCO/VICODIN) 5-325 MG tablet Take 1 tablet by mouth every 4 (four) hours as needed for moderate pain. 04/08/17 04/08/18  Chinita Pester, FNP  ibuprofen (ADVIL,MOTRIN) 600 MG tablet  Take 1 tablet (600 mg total) by mouth every 6 (six) hours as needed. 05/28/15   Joycie Peek, PA-C  ondansetron (ZOFRAN) 4 MG tablet Take 1 tablet (4 mg total) by mouth every 6 (six) hours. 04/15/15   Gilda Crease, MD  PRESCRIPTION MEDICATION Take 1 tablet by mouth at bedtime. Sleep medciation    Historical Provider, MD  PRESCRIPTION MEDICATION Take 1 tablet by mouth at bedtime. Pain medication    Historical Provider, MD  sulfamethoxazole-trimethoprim (BACTRIM DS,SEPTRA DS) 800-160 MG tablet Take 1 tablet by mouth 2 (two) times daily. 04/08/17   Chinita Pester, FNP  traZODone (DESYREL) 50 MG tablet Take 1 tablet (50 mg total) by mouth at bedtime. 08/16/16   Charlesetta Ivory Menshew, PA-C    Allergies Patient has no known allergies.  No family history on file.  Social History Social History  Substance Use Topics  . Smoking status: Current Some Day Smoker    Types: Cigarettes  . Smokeless tobacco: Never Used  . Alcohol use No    Review of Systems Constitutional: No fever/chills ENT: Positive for dental pain. Respiratory: Denies shortness of breath. Gastrointestinal: No abdominal pain.  No nausea, no vomiting.  Musculoskeletal: Negative for back pain. Skin: Positive for abscess under the left breast Neurological: Negative for headaches, focal weakness or numbness. ____________________________________________   PHYSICAL EXAM:  VITAL SIGNS: ED Triage Vitals  Enc Vitals Group  BP 04/08/17 1101 118/75     Pulse Rate 04/08/17 1101 86     Resp 04/08/17 1101 17     Temp 04/08/17 1101 99 F (37.2 C)     Temp Source 04/08/17 1101 Oral     SpO2 04/08/17 1101 100 %     Weight 04/08/17 1101 155 lb (70.3 kg)     Height 04/08/17 1101  (1.727 m)     Head Circumference --      Peak Flow --      Pain Score 04/08/17 1114 10     Pain Loc --      Pain Edu? --      Excl. in GC? --     Constitutional: Alert and oriented. Well appearing and in no acute distress. Eyes:  Conjunctivae are normal. PERRL. EOMI. Head: Atraumatic. Nose: No congestion/rhinnorhea. Mouth/Throat: Mucous membranes are moist.  Oropharynx non-erythematous. Chronic dental fracture noted on the right upper (#5) without gingival edema or obvious infection. Neck: No stridor.   Cardiovascular: Normal rate, regular rhythm. Grossly normal heart sounds.  Good peripheral circulation. Respiratory: Normal respiratory effort.  No retractions. Lungs CTAB. Gastrointestinal: Soft and nontender. No distention. No abdominal bruits. No CVA tenderness. Musculoskeletal: No lower extremity tenderness nor edema.  No joint effusions. Neurologic:  Normal speech and language. No gross focal neurologic deficits are appreciated. No gait instability. Skin:  Skin is warm, dry and intact. No rash noted. Psychiatric: Mood and affect are normal. Speech and behavior are normal.  ____________________________________________   LABS (all labs ordered are listed, but only abnormal results are displayed)  Labs Reviewed - No data to display ____________________________________________  EKG  ____________________________________________  RADIOLOGY  ____________________________________________   PROCEDURES  Procedure(s) performed: INCISION AND DRAINAGE Performed by: Kem Boroughs Consent: Verbal consent obtained.  Risks and benefits: risks, benefits and alternatives were discussed  Type: abscess  Body area: 0.5cm beneath skin fold of left breast  Anesthesia: local infiltration  Incision was made with a scalpel.  Local anesthetic: lidocaine 1%  Anesthetic total: 3 ml  Complexity: complex Blunt dissection to break up loculations  Drainage: purulent  Drainage amount: Small  Packing material: 1/4 in iodoform gauze  Patient tolerance: Patient tolerated the procedure well with no immediate complications.  Procedures  Critical Care performed:  No  ____________________________________________   INITIAL IMPRESSION / ASSESSMENT AND PLAN / ED COURSE  Pertinent labs & imaging results that were available during my care of the patient were reviewed by me and considered in my medical decision making (see chart for details).  46 year old female who presents to the emergency department for evaluation of dental pain and breast abscess. She will be treated with Norco and Bactrim. She was advised to follow up with the dentist as soon as possible. She was also advised to remove the packing in 2 days. She is to return to the ER for symptoms that change or worsen if unable to schedule an appointment. ____________________________________________   FINAL CLINICAL IMPRESSION(S) / ED DIAGNOSES  Final diagnoses:  Abscess of breast  Pain, dental      NEW MEDICATIONS STARTED DURING THIS VISIT:  Discharge Medication List as of 04/08/2017  1:19 PM       Note:  This document was prepared using Dragon voice recognition software and may include unintentional dictation errors.    Chinita Pester, FNP 04/08/17 1605    Governor Rooks, MD 04/08/17 [phone]

## 2017-04-08 NOTE — Discharge Instructions (Addendum)
Remove the packing in 2 days. Take the antibiotic as prescribed and until finished. Return to the ER in 2 days if you are not improving. Use a warm compress on the breast 4 times per day to encourage drainage.

## 2017-04-08 NOTE — ED Triage Notes (Signed)
Pt with sore area to left breast noticed two days ago and also having some right side dental pain.

## 2017-05-19 ENCOUNTER — Emergency Department
Admission: EM | Admit: 2017-05-19 | Discharge: 2017-05-19 | Disposition: A | Discharge status: Home / Self Care | Payer: MEDICAID | Attending: Emergency Medicine | Admitting: Emergency Medicine

## 2017-05-19 DIAGNOSIS — X102XXA Contact with fats and cooking oils, initial encounter: Secondary | ICD-10-CM | POA: Insufficient documentation

## 2017-05-19 DIAGNOSIS — Y999 Unspecified external cause status: Secondary | ICD-10-CM | POA: Insufficient documentation

## 2017-05-19 DIAGNOSIS — T300 Burn of unspecified body region, unspecified degree: Secondary | ICD-10-CM

## 2017-05-19 DIAGNOSIS — T23272A Burn of second degree of left wrist, initial encounter: Secondary | ICD-10-CM | POA: Insufficient documentation

## 2017-05-19 DIAGNOSIS — Y929 Unspecified place or not applicable: Secondary | ICD-10-CM | POA: Insufficient documentation

## 2017-05-19 DIAGNOSIS — T23209A Burn of second degree of unspecified hand, unspecified site, initial encounter: Secondary | ICD-10-CM

## 2017-05-19 DIAGNOSIS — T2111XA Burn of first degree of chest wall, initial encounter: Secondary | ICD-10-CM | POA: Insufficient documentation

## 2017-05-19 DIAGNOSIS — F17210 Nicotine dependence, cigarettes, uncomplicated: Secondary | ICD-10-CM | POA: Insufficient documentation

## 2017-05-19 DIAGNOSIS — Z79899 Other long term (current) drug therapy: Secondary | ICD-10-CM | POA: Insufficient documentation

## 2017-05-19 DIAGNOSIS — Y93G3 Activity, cooking and baking: Secondary | ICD-10-CM | POA: Insufficient documentation

## 2017-05-19 MED ORDER — SILVER SULFADIAZINE 1 % EX CREA: TOPICAL_CREAM | CUTANEOUS | 0 refills | Status: AC

## 2017-05-19 MED ORDER — IBUPROFEN 800 MG PO TABS: 800.0000 mg | ORAL_TABLET | Freq: Three times a day (TID) | ORAL | 0 refills | Status: DC | PRN

## 2017-05-19 MED ORDER — SILVER SULFADIAZINE 1 % EX CREA
TOPICAL_CREAM | CUTANEOUS | Status: AC
  Filled 2017-05-19: qty 85

## 2017-05-19 MED ORDER — TRAMADOL HCL 50 MG PO TABS: 50.0000 mg | ORAL_TABLET | Freq: Four times a day (QID) | ORAL | 0 refills | Status: AC | PRN

## 2017-05-19 MED ORDER — SILVER SULFADIAZINE 1 % EX CREA
TOPICAL_CREAM | Freq: Once | CUTANEOUS | Status: AC
  Administered 2017-05-19: 17:00:00 via TOPICAL

## 2017-05-19 NOTE — Discharge Instructions (Signed)
Please continue with topical antibiotic cream daily, follow-up with PCP or return to the emergency department in one week for recheck. Return to the ER for any worsening symptoms urgent changes in her health.
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
## 2017-05-19 NOTE — ED Notes (Signed)
AAOx3.  Skin warm and dry.  NAD 
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
## 2018-04-14 ENCOUNTER — Emergency Department
Admission: EM | Admit: 2018-04-14 | Discharge: 2018-04-14 | Disposition: A | Discharge status: Home / Self Care | Attending: Student in an Organized Health Care Education/Training Program | Admitting: Student in an Organized Health Care Education/Training Program

## 2018-04-14 ENCOUNTER — Encounter: Payer: Self-pay | Admitting: Emergency Medicine

## 2018-04-14 ENCOUNTER — Emergency Department

## 2018-04-14 ENCOUNTER — Other Ambulatory Visit: Payer: Self-pay

## 2018-04-14 DIAGNOSIS — R0789 Other chest pain: Secondary | ICD-10-CM

## 2018-04-14 DIAGNOSIS — F17210 Nicotine dependence, cigarettes, uncomplicated: Secondary | ICD-10-CM | POA: Insufficient documentation

## 2018-04-14 DIAGNOSIS — K5900 Constipation, unspecified: Secondary | ICD-10-CM | POA: Insufficient documentation

## 2018-04-14 DIAGNOSIS — Z79899 Other long term (current) drug therapy: Secondary | ICD-10-CM | POA: Insufficient documentation

## 2018-04-14 LAB — URINALYSIS, COMPLETE (UACMP) WITH MICROSCOPIC
Bilirubin Urine: NEGATIVE
GLUCOSE, UA: NEGATIVE mg/dL
Ketones, ur: NEGATIVE mg/dL
Leukocytes, UA: NEGATIVE
NITRITE: NEGATIVE
PH: 5 (ref 5.0–8.0)
Protein, ur: NEGATIVE mg/dL
SPECIFIC GRAVITY, URINE: 1.025 (ref 1.005–1.030)

## 2018-04-14 LAB — COMPREHENSIVE METABOLIC PANEL
ALBUMIN: 4.2 g/dL (ref 3.5–5.0)
ALK PHOS: 92 U/L (ref 38–126)
ALT: 28 U/L (ref 14–54)
ANION GAP: 6 (ref 5–15)
AST: 35 U/L (ref 15–41)
BILIRUBIN TOTAL: 0.4 mg/dL (ref 0.3–1.2)
BUN: 13 mg/dL (ref 6–20)
CALCIUM: 9.6 mg/dL (ref 8.9–10.3)
CO2: 26 mmol/L (ref 22–32)
Chloride: 106 mmol/L (ref 101–111)
Creatinine, Ser: 0.67 mg/dL (ref 0.44–1.00)
GFR calc Af Amer: >60 mL/min (ref >60)
GLUCOSE: 103 mg/dL — AB (ref 65–99)
Potassium: 3.8 mmol/L (ref 3.5–5.1)
Sodium: 138 mmol/L (ref 135–145)
TOTAL PROTEIN: 7.6 g/dL (ref 6.5–8.1)

## 2018-04-14 LAB — CBC
HCT: 33 % — ABNORMAL LOW (ref 35.0–47.0)
Hemoglobin: 11.3 g/dL — ABNORMAL LOW (ref 12.0–16.0)
MCH: 31.5 pg (ref 26.0–34.0)
MCHC: 34.2 g/dL (ref 32.0–36.0)
MCV: 92.1 fL (ref 80.0–100.0)
PLATELETS: 256 10*3/uL (ref 150–440)
RBC: 3.58 MIL/uL — AB (ref 3.80–5.20)
RDW: 14.1 % (ref 11.5–14.5)
WBC: 6.6 10*3/uL (ref 3.6–11.0)

## 2018-04-14 LAB — LIPASE, BLOOD: Lipase: 32 U/L (ref 11–51)

## 2018-04-14 LAB — POCT PREGNANCY, URINE: Preg Test, Ur: NEGATIVE

## 2018-04-14 LAB — TROPONIN I

## 2018-04-14 MED ORDER — MAGNESIUM CITRATE PO SOLN
1.0000 | Freq: Once | ORAL | Status: AC
  Administered 2018-04-14: 1 via ORAL

## 2018-04-14 MED ORDER — LACTULOSE 20 G PO PACK: 20.0000 g | PACK | Freq: Three times a day (TID) | ORAL | 0 refills | Status: DC

## 2018-04-14 MED ORDER — MAGNESIUM CITRATE PO SOLN
ORAL | Status: AC
  Filled 2018-04-14: qty 296

## 2018-04-14 NOTE — Discharge Instructions (Signed)
To help with constipation I recommend mixing one glass of prune juice with a tablespoon of mineral oil.  You can then warm it up in the microwave and drink.

## 2018-04-14 NOTE — ED Provider Notes (Signed)
[facility] Emergency Department Provider Note    First MD Initiated Contact with Patient 04/14/18 2033     (approximate)  I have reviewed the triage vital signs and the nursing notes.   HISTORY  Chief Complaint Chest Pain    HPI Erica BeaversMary Wiggins is a 47 y.o. female presents to the ER with multiple complaints.  Primarily which is several months of not being able to move her bowels despite being started on MiraLAX and stool softeners.  Patient does describe some chest discomfort.  Denies any pain at this time.  She is she is also having some tingling to bilateral upper extremities that have resolved.  No pain ripping or tearing through to her back.  Has had multiple similar symptoms in the past.  Denies any recent fevers.  No new medication changes.  She is not on any chronic pain medications.  She is able to eat and drink.  Past Medical History:  Diagnosis Date  . Stress    No family history on file. Past Surgical History:  Procedure Laterality Date  . FEMUR SURGERY     while in the service  . HIP SURGERY     occured while in service   Patient Active Problem List   Diagnosis Date Noted  . Stress       Prior to Admission medications   Medication Sig Start Date End Date Taking? Authorizing Provider  acetaminophen (TYLENOL) 500 MG tablet Take 1,000 mg by mouth every 6 (six) hours as needed for fever.    [provider]  cephALEXin (KEFLEX) 500 MG capsule Take 1 capsule (500 mg total) by mouth 4 (four) times daily. 04/15/15   Gilda CreasePollina, Christopher J, MD  diphenoxylate-atropine (LOMOTIL) 2.5-0.025 MG per tablet Take 2 tablets by mouth 4 (four) times daily as needed for diarrhea or loose stools. 04/15/15   Gilda CreasePollina, Christopher J, MD  DULoxetine (CYMBALTA) 60 MG capsule Take 1 capsule (60 mg total) by mouth daily. 08/16/16 08/16/17  Menshew, Charlesetta IvoryJenise V Bacon, PA-C  ferrous sulfate 325 (65 FE) MG tablet Take 325 mg by mouth daily. 02/21/15 02/21/16  [provider]  ibuprofen (ADVIL,MOTRIN) 800 MG tablet Take 1 tablet (800 mg total) by mouth every 8 (eight) hours as needed. 05/19/17   Evon SlackGaines, Thomas C, PA-C  ondansetron (ZOFRAN) 4 MG tablet Take 1 tablet (4 mg total) by mouth every 6 (six) hours. 04/15/15   Gilda CreasePollina, Christopher J, MD  PRESCRIPTION MEDICATION Take 1 tablet by mouth at bedtime. Sleep medciation    [provider]  PRESCRIPTION MEDICATION Take 1 tablet by mouth at bedtime. Pain medication    [provider]  silver sulfADIAZINE (SILVADENE) 1 % cream Apply to affected area daily 05/19/17 05/19/18  Evon SlackGaines, Thomas C, PA-C  sulfamethoxazole-trimethoprim (BACTRIM DS,SEPTRA DS) 800-160 MG tablet Take 1 tablet by mouth 2 (two) times daily. 04/08/17   Triplett, Rulon Eisenmengerari B, FNP  traMADol (ULTRAM) 50 MG tablet Take 1 tablet (50 mg total) by mouth every 6 (six) hours as needed. 05/19/17 05/19/18  Evon SlackGaines, Thomas C, PA-C  traZODone (DESYREL) 50 MG tablet Take 1 tablet (50 mg total) by mouth at bedtime. 08/16/16   Menshew, Charlesetta IvoryJenise V Bacon, PA-C    Allergies Patient has no known allergies.    Social History Social History   Tobacco Use  . Smoking status: Current Some Day Smoker    Types: Cigarettes  . Smokeless tobacco: Never Used  Substance Use Topics  . Alcohol use: No  .  Drug use: No    Review of Systems Patient denies headaches, rhinorrhea, blurry vision, numbness, shortness of breath, chest pain, edema, cough, abdominal pain, nausea, vomiting, diarrhea, dysuria, fevers, rashes or hallucinations unless otherwise stated above in HPI. ____________________________________________   PHYSICAL EXAM:  VITAL SIGNS: Vitals:   04/14/18 1917  BP: 117/83  Pulse: 84  Resp: 18  Temp: 98 F (36.7 C)  SpO2: 100%    Constitutional: Alert and oriented. Well appearing and in no acute distress. Eyes: Conjunctivae are normal.  Head: Atraumatic. Nose: No congestion/rhinnorhea. Mouth/Throat: Mucous membranes are moist.   Neck:  No stridor. Painless ROM.  Cardiovascular: Normal rate, regular rhythm. Grossly normal heart sounds.  Good peripheral circulation. Respiratory: Normal respiratory effort.  No retractions. Lungs CTAB. Gastrointestinal: Soft, obese, non tender No distention. No abdominal bruits. No CVA tenderness. Musculoskeletal: No lower extremity tenderness nor edema.  No joint effusions. Neurologic:  Normal speech and language. No gross focal neurologic deficits are appreciated. No facial droop Skin:  Skin is warm, dry and intact. No rash noted. Psychiatric: Speech and behavior are normal.  ____________________________________________   LABS (all labs ordered are listed, but only abnormal results are displayed)  Results for orders placed or performed during the hospital encounter of 04/14/18 (from the past 24 hour(s))  CBC     Status: Abnormal   Collection Time: 04/14/18  7:20 PM  Result Value Ref Range   WBC 6.6 3.6 - 11.0 K/uL   RBC 3.58 (L) 3.80 - 5.20 MIL/uL   Hemoglobin 11.3 (L) 12.0 - 16.0 g/dL   HCT 16.1 (L) 09.6 - 04.5 %   MCV 92.1 80.0 - 100.0 fL   MCH 31.5 26.0 - 34.0 pg   MCHC 34.2 32.0 - 36.0 g/dL   RDW 40.9 81.1 - 91.4 %   Platelets 256 150 - 440 K/uL  Troponin I     Status: None   Collection Time: 04/14/18  7:20 PM  Result Value Ref Range   Troponin I <0.03 <0.03 ng/mL  Comprehensive metabolic panel     Status: Abnormal   Collection Time: 04/14/18  7:20 PM  Result Value Ref Range   Sodium 138 135 - 145 mmol/L   Potassium 3.8 3.5 - 5.1 mmol/L   Chloride 106 101 - 111 mmol/L   CO2 26 22 - 32 mmol/L   Glucose, Bld 103 (H) 65 - 99 mg/dL   BUN 13 6 - 20 mg/dL   Creatinine, Ser 7.82 0.44 - 1.00 mg/dL   Calcium 9.6 8.9 - 95.6 mg/dL   Total Protein 7.6 6.5 - 8.1 g/dL   Albumin 4.2 3.5 - 5.0 g/dL   AST 35 15 - 41 U/L   ALT 28 14 - 54 U/L   Alkaline Phosphatase 92 38 - 126 U/L   Total Bilirubin 0.4 0.3 - 1.2 mg/dL   GFR calc non Af Amer >60 >60 mL/min   GFR calc Af Amer >60 >60  mL/min   Anion gap 6 5 - 15  Lipase, blood     Status: None   Collection Time: 04/14/18  7:20 PM  Result Value Ref Range   Lipase 32 11 - 51 U/L  Urinalysis, Complete w Microscopic     Status: Abnormal   Collection Time: 04/14/18  7:29 PM  Result Value Ref Range   Color, Urine YELLOW (A) YELLOW   APPearance HAZY (A) CLEAR   Specific Gravity, Urine 1.025 1.005 - 1.030   pH 5.0 5.0 - 8.0  Glucose, UA NEGATIVE NEGATIVE mg/dL   Hgb urine dipstick SMALL (A) NEGATIVE   Bilirubin Urine NEGATIVE NEGATIVE   Ketones, ur NEGATIVE NEGATIVE mg/dL   Protein, ur NEGATIVE NEGATIVE mg/dL   Nitrite NEGATIVE NEGATIVE   Leukocytes, UA NEGATIVE NEGATIVE   RBC / HPF 0-5 0 - 5 RBC/hpf   WBC, UA 0-5 0 - 5 WBC/hpf   Bacteria, UA RARE (A) NONE SEEN   Squamous Epithelial / LPF 0-5 (A) NONE SEEN   Mucus PRESENT   Pregnancy, urine POC     Status: None   Collection Time: 04/14/18  7:31 PM  Result Value Ref Range   Preg Test, Ur NEGATIVE NEGATIVE   ____________________________________________  EKG My review and personal interpretation at Time: 19:19   Indication: chest pain  Rate: 85  Rhythm: sinus Axis: normal Other: poor r wave progression, no stemi ____________________________________________  RADIOLOGY  I personally reviewed all radiographic images ordered to evaluate for the above acute complaints and reviewed radiology reports and findings.  These findings were personally discussed with the patient.  Please see medical record for radiology report.  ____________________________________________   PROCEDURES  Procedure(s) performed:  Procedures    Critical Care performed: no ____________________________________________   INITIAL IMPRESSION / ASSESSMENT AND PLAN / ED COURSE  Pertinent labs & imaging results that were available during my care of the patient were reviewed by me and considered in my medical decision making (see chart for details).  DDX: ACS, PE, constipation, enteritis,  SBO, stone, pregnancy  Erica Wiggins is a 47 y.o. who presents to the ED with symptoms as described above.  Patient well-appearing in no acute distress.  No evidence of acute ischemia.  Presentation not clinically consistent with ACS.  Patient is low risk by Wells criteria.  Not clinically consistent with PE.  Symptoms have been ongoing for several months and majority of her symptoms seem to be related to her having difficulty moving her bowels.  Did offer enema the patient preferring outpatient treatment which I think is reasonable because she does not appear obstructed.  Discussed alternative therapies have given her dose of mag citrate here in the ER.  Discussed signs and symptoms for which she should return to the ER.      As part of my medical decision making, I reviewed the following data within the electronic MEDICAL RECORD NUMBER Nursing notes reviewed and incorporated, Labs reviewed, notes from prior ED visits.   ____________________________________________   FINAL CLINICAL IMPRESSION(S) / ED DIAGNOSES  Final diagnoses:  Atypical chest pain  Constipation, unspecified constipation type      NEW MEDICATIONS STARTED DURING THIS VISIT:  New Prescriptions   No medications on file     Note:  This document was prepared using Dragon voice recognition software and may include unintentional dictation errors.    Willy Eddy, MD 04/14/18 2233

## 2018-04-14 NOTE — ED Triage Notes (Addendum)
Pt to triage via w/c with no distress noted; reports CP x 2 months beneath left breast nonradiating with no accomp symptoms; also st multiple c/o generalized HA and "fingers tingling", hot flashes, constipation for months

## 2018-04-14 NOTE — ED Notes (Signed)
Pt c/o left sided chest pain that began 2 days ago. Pt reports numbness in both arms and H/A. Pt denies any nausea.

## 2019-11-05 IMAGING — CR DG CHEST 2V
2 series · 2 of 2 positions shown · non-contrast
Comparison: None.

CLINICAL DATA: Chest pain for 2 months localizing to under the left
breast.

EXAM:
CHEST - 2 VIEW

[chest pa]
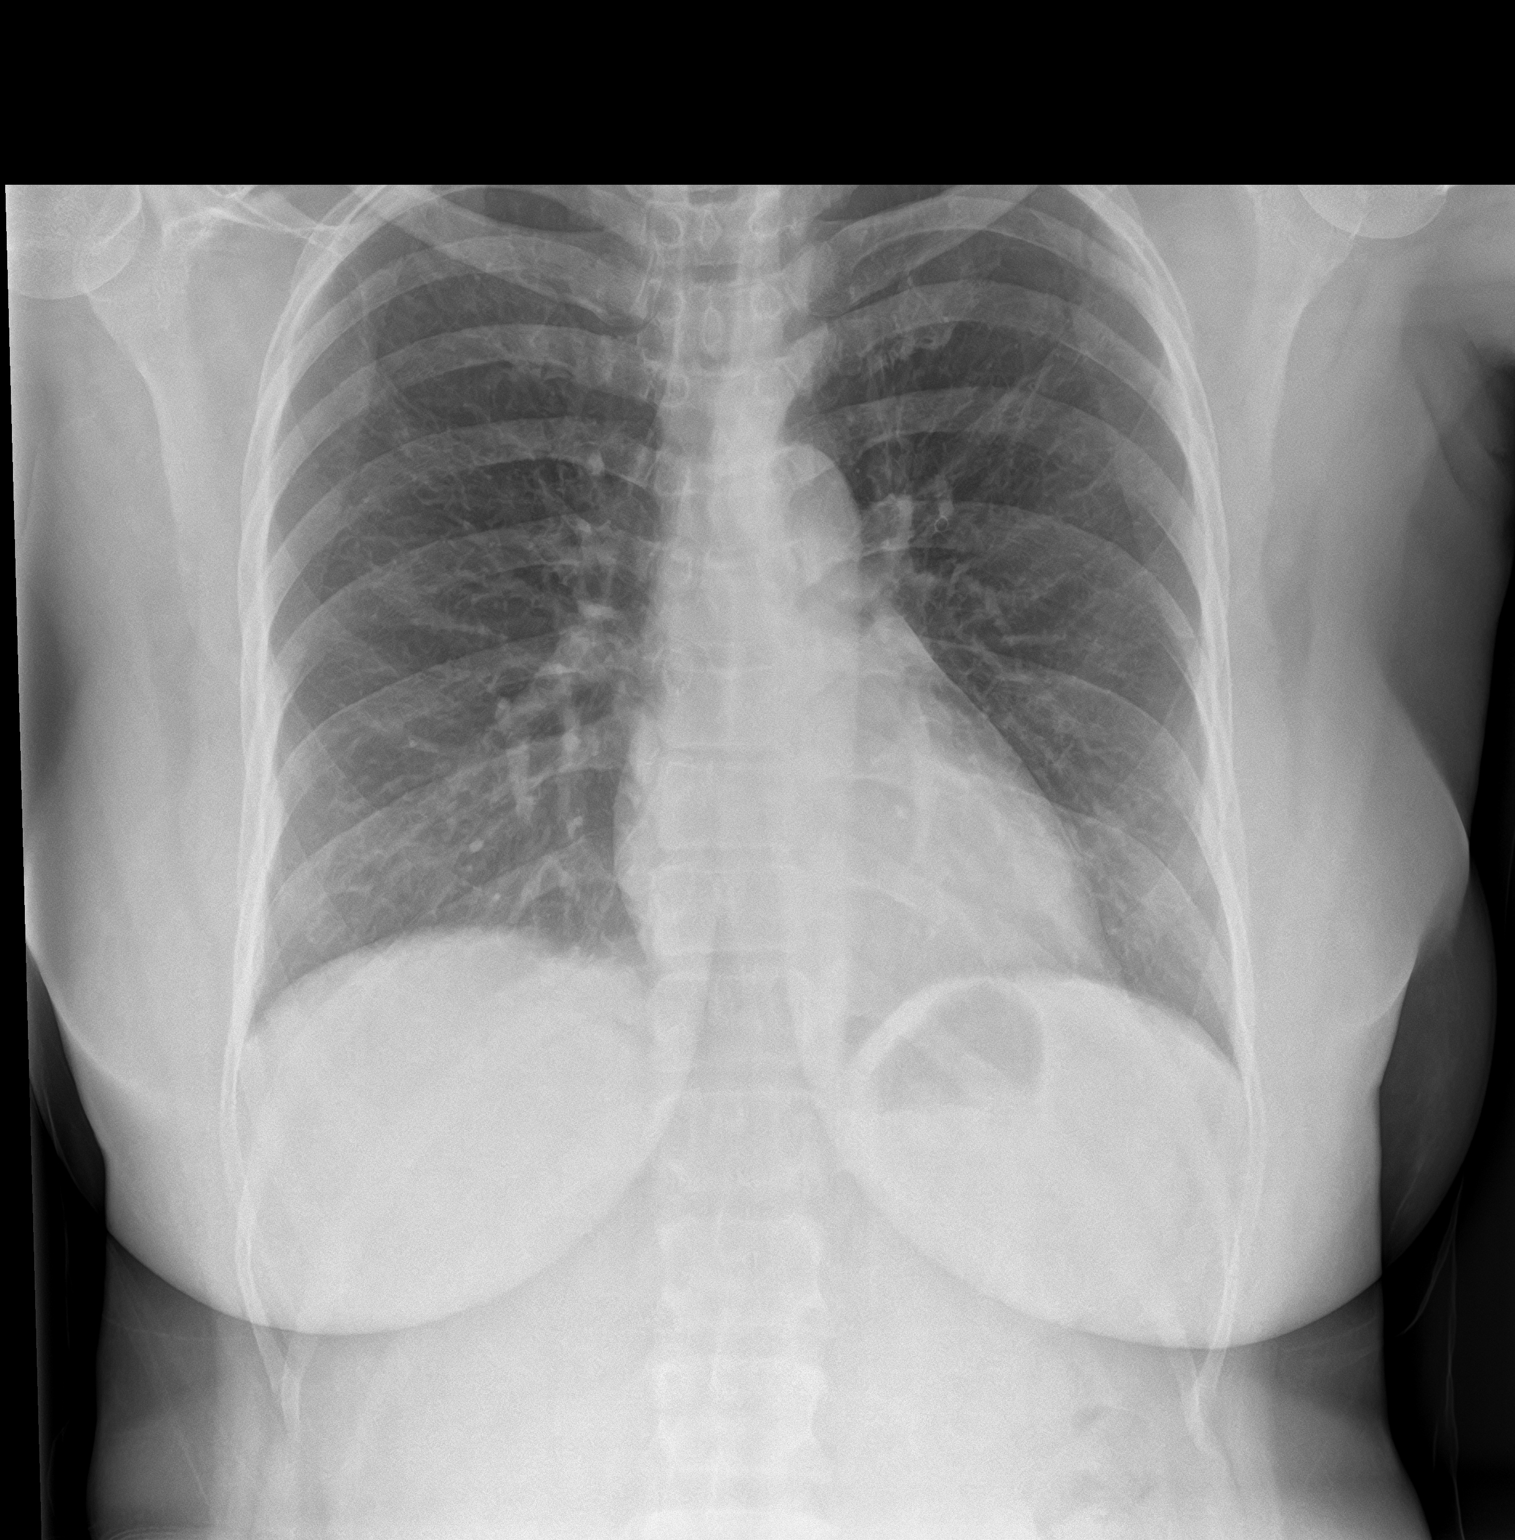

[chest lat]
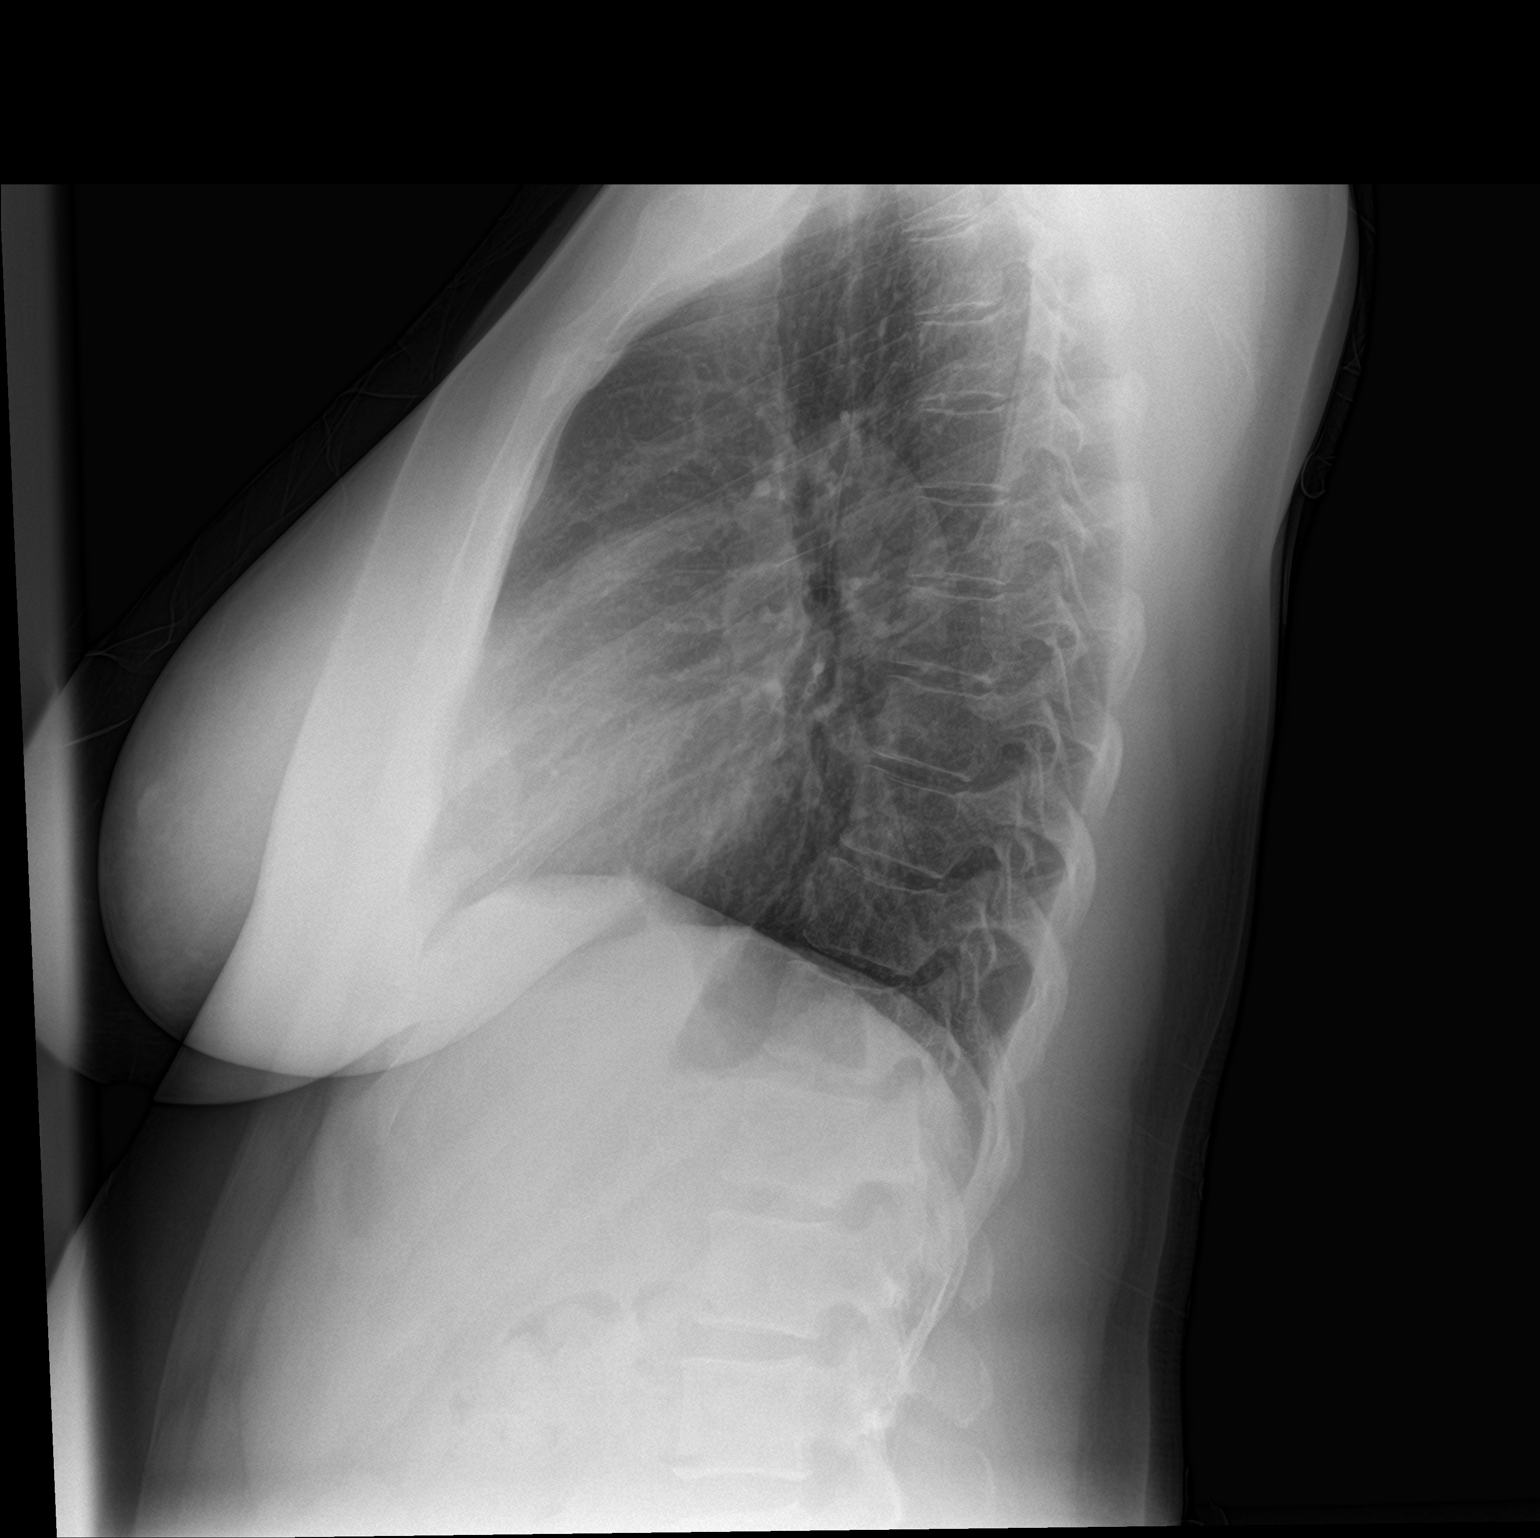

[2 of 2 positions shown; findings below may reference images not displayed]

FINDINGS: The heart size and mediastinal contours are within normal limits.
There is no evidence of pulmonary edema, consolidation,
pneumothorax, nodule or pleural fluid. The visualized skeletal
structures are unremarkable.
IMPRESSION: No active cardiopulmonary disease.

## 2021-08-04 ENCOUNTER — Emergency Department (HOSPITAL_COMMUNITY)
Admission: EM | Admit: 2021-08-04 | Discharge: 2021-08-04 | Disposition: A | Discharge status: Home / Self Care | Payer: Medicaid Other | Attending: Emergency Medicine | Admitting: Emergency Medicine

## 2021-08-04 ENCOUNTER — Encounter (HOSPITAL_COMMUNITY): Payer: Self-pay | Admitting: Emergency Medicine

## 2021-08-04 DIAGNOSIS — Y9389 Activity, other specified: Secondary | ICD-10-CM | POA: Insufficient documentation

## 2021-08-04 DIAGNOSIS — F17210 Nicotine dependence, cigarettes, uncomplicated: Secondary | ICD-10-CM | POA: Insufficient documentation

## 2021-08-04 DIAGNOSIS — Y92512 Supermarket, store or market as the place of occurrence of the external cause: Secondary | ICD-10-CM | POA: Diagnosis not present

## 2021-08-04 DIAGNOSIS — S7011XA Contusion of right thigh, initial encounter: Secondary | ICD-10-CM | POA: Diagnosis not present

## 2021-08-04 DIAGNOSIS — M79652 Pain in left thigh: Secondary | ICD-10-CM | POA: Diagnosis not present

## 2021-08-04 DIAGNOSIS — S79921A Unspecified injury of right thigh, initial encounter: Secondary | ICD-10-CM | POA: Diagnosis present
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
## 2021-08-04 NOTE — Discharge Instructions (Addendum)
You were seen in the emergency department today with pain to the thighs.  Please take Tylenol and/or Motrin as needed for pain symptoms.  Please follow closely with your primary care doctor.  Return to the emergency department any new or suddenly worsening symptoms.
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
## 2021-08-04 NOTE — ED Notes (Signed)
Alert, NAD, calm, interactive.  

## 2022-07-06 ENCOUNTER — Encounter (HOSPITAL_COMMUNITY): Payer: Self-pay

## 2022-07-06 ENCOUNTER — Other Ambulatory Visit: Payer: Self-pay

## 2022-07-06 ENCOUNTER — Emergency Department (HOSPITAL_COMMUNITY)
Admission: EM | Admit: 2022-07-06 | Discharge: 2022-07-06 | Disposition: A | Discharge status: Home / Self Care | Payer: Medicaid Other | Attending: Emergency Medicine | Admitting: Emergency Medicine

## 2022-07-06 DIAGNOSIS — K0889 Other specified disorders of teeth and supporting structures: Secondary | ICD-10-CM

## 2022-07-06 DIAGNOSIS — K029 Dental caries, unspecified: Secondary | ICD-10-CM

## 2022-07-06 MED ORDER — PENICILLIN V POTASSIUM 500 MG PO TABS: 500.0000 mg | ORAL_TABLET | Freq: Four times a day (QID) | ORAL | 0 refills | Status: DC

## 2022-07-06 MED ORDER — PENICILLIN V POTASSIUM 500 MG PO TABS: 500.0000 mg | ORAL_TABLET | Freq: Four times a day (QID) | ORAL | 0 refills | Status: AC

## 2022-07-06 MED ORDER — MELOXICAM 7.5 MG PO TABS: 7.5000 mg | ORAL_TABLET | Freq: Every day | ORAL | 0 refills | Status: DC

## 2022-07-06 NOTE — ED Provider Notes (Signed)
[facility] EMERGENCY DEPARTMENT Provider Note   CSN: [id number] Arrival date & time: 07/06/22  4315     History  Chief Complaint  Patient presents with   Dental Pain    Erica Wiggins is a 51 y.o. female.  51 yo Female who was previously seen by [employer] [facility] for teeth extraction now presents to the ED for worsening dental pain X 2 years. Pre-Covid pt had all upper teeth and front two bottom teeth (24 & 25) extracted at [facility]. During covid the clinic shut down. Since pt c/o generalized gum pain throughout her entire mouth. She has attempted to contact [facility] multiple times, and was recently informed they will not receive her call to discuss further care until October 2023. Over the last two weeks pt c/o constant worsening pain in her 23rd and 26th teeth. Pain exacerbated with eating. Pain does not extend into her jaw, tongue, chin, neck, cheeks or ears. No fevers or systemic symptoms. Today, pt's primary concerns are pain control and dental referral.  Denies trauma, fever, drainage.       Home Medications Prior to Admission medications   Medication Sig Start Date End Date Taking? Authorizing Provider  acetaminophen (TYLENOL) 500 MG tablet Take 1,000 mg by mouth every 6 (six) hours as needed for fever.    [provider]  diphenoxylate-atropine (LOMOTIL) 2.5-0.025 MG per tablet Take 2 tablets by mouth 4 (four) times daily as needed for diarrhea or loose stools. 04/15/15   Gilda Crease, MD  DULoxetine (CYMBALTA) 60 MG capsule Take 1 capsule (60 mg total) by mouth daily. 08/16/16 08/16/17  Menshew, Charlesetta Ivory, PA-C  ferrous sulfate 325 (65 FE) MG tablet Take 325 mg by mouth daily. 02/21/15 02/21/16  [provider]  lactulose (CEPHULAC) 20 g packet Take 1 packet (20 g total) by mouth 3 (three) times daily. 04/14/18   Willy Eddy, MD  meloxicam (MOBIC) 7.5 MG tablet Take 1 tablet (7.5 mg total) by mouth daily. 07/06/22   Jeannie Fend,  PA-C  ondansetron (ZOFRAN) 4 MG tablet Take 1 tablet (4 mg total) by mouth every 6 (six) hours. 04/15/15   Gilda Crease, MD  penicillin v potassium (VEETID) 500 MG tablet Take 1 tablet (500 mg total) by mouth 4 (four) times daily for 10 days. 07/06/22 07/16/22  Jeannie Fend, PA-C  traZODone (DESYREL) 50 MG tablet Take 1 tablet (50 mg total) by mouth at bedtime. 08/16/16   Menshew, Charlesetta Ivory, PA-C      Allergies    Patient has no known allergies.    Review of Systems   Review of Systems Negative except as per HPI Physical Exam Updated Vital Signs BP (!) 155/100 (BP Location: Right Arm)   Pulse (!) 109   Temp 98.5 F (36.9 C) (Oral)   Resp 16   Ht 5\' 5"  (1.651 m)   Wt 81.6 kg   LMP 06/21/2022 (Exact Date)   SpO2 99%   BMI 29.95 kg/m  Physical Exam Vitals and nursing note reviewed.  Constitutional:      General: She is not in acute distress.    Appearance: She is well-developed. She is not diaphoretic.  HENT:     Head: Normocephalic and atraumatic.     Jaw: No trismus.     Nose: Nose normal.     Mouth/Throat:     Comments: Upper teeth have all been removed.  Lower central incisors removed.  Remaining lower teeth with  severe decay blackened appearance at the base of the teeth.   Eyes:     Conjunctiva/sclera: Conjunctivae normal.  Pulmonary:     Effort: Pulmonary effort is normal.  Musculoskeletal:     Cervical back: Neck supple.  Lymphadenopathy:     Cervical: No cervical adenopathy.  Skin:    General: Skin is warm and dry.     Findings: No erythema or rash.  Neurological:     Mental Status: She is alert and oriented to person, place, and time.  Psychiatric:        Behavior: Behavior normal.     ED Results / Procedures / Treatments   Labs (all labs ordered are listed, but only abnormal results are displayed) Labs Reviewed - No data to display  EKG None  Radiology No results found.  Procedures Procedures    Medications Ordered in  ED Medications - No data to display  ED Course/ Medical Decision Making/ A&P                           Medical Decision Making  51 year old female presents with concern for dental pain for the past 2 weeks.  Reports having had teeth extracted with plan for implants prior to the pandemic however dental clinic is not scheduled to reopen again until October.  Patient is seeking care for her dental pain which is acute in nature without trauma, fever, drainage.  Also requesting referral to dental clinic locally.  Found to have significant decay to her lower teeth.  Due to acute nature of her pain, will cover with antibiotics also given prescription for NSAID.  Patient is given information on on-call dentist as well as dental referral resource guide and advised to also discuss care with [facility] to manage her dental needs.        Final Clinical Impression(s) / ED Diagnoses Final diagnoses:  Pain, dental  Dental decay    Rx / DC Orders ED Discharge Orders          Ordered    penicillin v potassium (VEETID) 500 MG tablet  4 times daily,   Status:  Discontinued        07/06/22 0854    meloxicam (MOBIC) 7.5 MG tablet  Daily,   Status:  Discontinued        07/06/22 0854    meloxicam (MOBIC) 7.5 MG tablet  Daily        07/06/22 0915    penicillin v potassium (VEETID) 500 MG tablet  4 times daily        07/06/22 0915              Jeannie Fend, PA-C 07/06/22 8469    Gerhard Munch, MD 07/06/22 [phone]

## 2022-07-06 NOTE — ED Triage Notes (Signed)
Pt has pain in mouth x 2 weeks, has been having teeth pulled to get dentures but dentist will not be back open until October.  Pt has taken tylenol and aspirin w/o relief.  Pt has swelling around lower teeth.

## 2022-07-06 NOTE — Discharge Instructions (Addendum)
See references given for dental care.  You can also contact [facility] for care.  Prescription given for antibiotics, take as prescribed and complete the full course.  Pain medication as prescribed as needed. Rinse with Listerine after every meal.

## 2022-08-05 ENCOUNTER — Inpatient Hospital Stay: Payer: Medicaid Other

## 2022-08-05 ENCOUNTER — Emergency Department: Payer: Medicaid Other

## 2022-08-05 ENCOUNTER — Encounter: Payer: Self-pay | Admitting: Emergency Medicine

## 2022-08-05 ENCOUNTER — Other Ambulatory Visit: Payer: Self-pay

## 2022-08-05 ENCOUNTER — Inpatient Hospital Stay
Admission: EM | Admit: 2022-08-05 | Discharge: 2022-08-07 | DRG: 440 | Disposition: A | Discharge status: Home / Self Care | Payer: Medicaid Other | Attending: Internal Medicine | Admitting: Internal Medicine

## 2022-08-05 DIAGNOSIS — R7989 Other specified abnormal findings of blood chemistry: Secondary | ICD-10-CM | POA: Diagnosis present

## 2022-08-05 DIAGNOSIS — F17210 Nicotine dependence, cigarettes, uncomplicated: Secondary | ICD-10-CM | POA: Diagnosis present

## 2022-08-05 DIAGNOSIS — E669 Obesity, unspecified: Secondary | ICD-10-CM | POA: Diagnosis present

## 2022-08-05 DIAGNOSIS — R7401 Elevation of levels of liver transaminase levels: Secondary | ICD-10-CM

## 2022-08-05 DIAGNOSIS — Y92009 Unspecified place in unspecified non-institutional (private) residence as the place of occurrence of the external cause: Secondary | ICD-10-CM | POA: Diagnosis not present

## 2022-08-05 DIAGNOSIS — Z6836 Body mass index (BMI) 36.0-36.9, adult: Secondary | ICD-10-CM | POA: Diagnosis not present

## 2022-08-05 DIAGNOSIS — Z79899 Other long term (current) drug therapy: Secondary | ICD-10-CM

## 2022-08-05 DIAGNOSIS — T50905A Adverse effect of unspecified drugs, medicaments and biological substances, initial encounter: Secondary | ICD-10-CM | POA: Diagnosis present

## 2022-08-05 DIAGNOSIS — Z20822 Contact with and (suspected) exposure to covid-19: Secondary | ICD-10-CM | POA: Diagnosis present

## 2022-08-05 DIAGNOSIS — K859 Acute pancreatitis without necrosis or infection, unspecified: Principal | ICD-10-CM

## 2022-08-05 DIAGNOSIS — K853 Drug induced acute pancreatitis without necrosis or infection: Principal | ICD-10-CM | POA: Diagnosis present

## 2022-08-05 DIAGNOSIS — Z791 Long term (current) use of non-steroidal anti-inflammatories (NSAID): Secondary | ICD-10-CM | POA: Diagnosis not present

## 2022-08-05 DIAGNOSIS — K851 Biliary acute pancreatitis without necrosis or infection: Secondary | ICD-10-CM

## 2022-08-05 DIAGNOSIS — K810 Acute cholecystitis: Secondary | ICD-10-CM | POA: Diagnosis not present

## 2022-08-05 DIAGNOSIS — F411 Generalized anxiety disorder: Secondary | ICD-10-CM | POA: Diagnosis present

## 2022-08-05 DIAGNOSIS — K5900 Constipation, unspecified: Secondary | ICD-10-CM | POA: Diagnosis present

## 2022-08-05 DIAGNOSIS — I10 Essential (primary) hypertension: Secondary | ICD-10-CM | POA: Diagnosis present

## 2022-08-05 DIAGNOSIS — K760 Fatty (change of) liver, not elsewhere classified: Secondary | ICD-10-CM | POA: Diagnosis present

## 2022-08-05 LAB — BASIC METABOLIC PANEL
Anion gap: 7 (ref 5–15)
BUN: 11 mg/dL (ref 6–20)
CO2: 25 mmol/L (ref 22–32)
Calcium: 9.4 mg/dL (ref 8.9–10.3)
Chloride: 105 mmol/L (ref 98–111)
Creatinine, Ser: 0.82 mg/dL (ref 0.44–1.00)
GFR, Estimated: >60 mL/min (ref >60)
Glucose, Bld: 130 mg/dL — ABNORMAL HIGH (ref 70–99)
Potassium: 3.5 mmol/L (ref 3.5–5.1)
Sodium: 137 mmol/L (ref 135–145)

## 2022-08-05 LAB — HEPATIC FUNCTION PANEL
ALT: 144 U/L — ABNORMAL HIGH (ref 0–44)
AST: 199 U/L — ABNORMAL HIGH (ref 15–41)
Albumin: 4.7 g/dL (ref 3.5–5.0)
Alkaline Phosphatase: 157 U/L — ABNORMAL HIGH (ref 38–126)
Bilirubin, Direct: 0.4 mg/dL — ABNORMAL HIGH (ref 0.0–0.2)
Indirect Bilirubin: 1 mg/dL — ABNORMAL HIGH (ref 0.3–0.9)
Total Bilirubin: 1.4 mg/dL — ABNORMAL HIGH (ref 0.3–1.2)
Total Protein: 9 g/dL — ABNORMAL HIGH (ref 6.5–8.1)

## 2022-08-05 LAB — TROPONIN I (HIGH SENSITIVITY)
Troponin I (High Sensitivity): 4 ng/L (ref <18)
Troponin I (High Sensitivity): 5 ng/L (ref <18)

## 2022-08-05 LAB — CBC
HCT: 36.1 % (ref 36.0–46.0)
Hemoglobin: 11.8 g/dL — ABNORMAL LOW (ref 12.0–15.0)
MCH: 29.7 pg (ref 26.0–34.0)
MCHC: 32.7 g/dL (ref 30.0–36.0)
MCV: 90.9 fL (ref 80.0–100.0)
Platelets: 275 10*3/uL (ref 150–400)
RBC: 3.97 MIL/uL (ref 3.87–5.11)
RDW: 13.8 % (ref 11.5–15.5)
WBC: 8.6 10*3/uL (ref 4.0–10.5)
nRBC: 0 % (ref 0.0–0.2)

## 2022-08-05 LAB — POC URINE PREG, ED: Preg Test, Ur: NEGATIVE

## 2022-08-05 LAB — LIPASE, BLOOD: Lipase: 3418 U/L — ABNORMAL HIGH (ref 11–51)

## 2022-08-05 LAB — SARS CORONAVIRUS 2 BY RT PCR: SARS Coronavirus 2 by RT PCR: NEGATIVE

## 2022-08-05 MED ORDER — LACTATED RINGERS IV SOLN: INTRAVENOUS | Status: DC

## 2022-08-05 MED ORDER — HYDROMORPHONE HCL 1 MG/ML IJ SOLN
1.0000 mg | Freq: Once | INTRAMUSCULAR | Status: AC
  Administered 2022-08-05: 1 mg via INTRAVENOUS
  Filled 2022-08-05: qty 1

## 2022-08-05 MED ORDER — IOHEXOL 300 MG/ML  SOLN
100.0000 mL | Freq: Once | INTRAMUSCULAR | Status: AC | PRN
Start: 2022-08-05 — End: 2022-08-05
  Administered 2022-08-05: 100 mL via INTRAVENOUS

## 2022-08-05 MED ORDER — GADOBUTROL 1 MMOL/ML IV SOLN
10.0000 mL | Freq: Once | INTRAVENOUS | Status: AC | PRN
  Administered 2022-08-05: 10 mL via INTRAVENOUS

## 2022-08-05 MED ORDER — PIPERACILLIN-TAZOBACTAM 3.375 G IVPB
3.3750 g | Freq: Three times a day (TID) | INTRAVENOUS | Status: DC
  Administered 2022-08-05 – 2022-08-06 (×2): 3.375 g via INTRAVENOUS
  Filled 2022-08-05 (×2): qty 50

## 2022-08-05 MED ORDER — ACETAMINOPHEN 325 MG PO TABS: 650.0000 mg | ORAL_TABLET | Freq: Four times a day (QID) | ORAL | Status: DC | PRN

## 2022-08-05 MED ORDER — ONDANSETRON HCL 4 MG/2ML IJ SOLN
4.0000 mg | Freq: Once | INTRAMUSCULAR | Status: AC
  Administered 2022-08-05: 4 mg via INTRAVENOUS
  Filled 2022-08-05: qty 2

## 2022-08-05 MED ORDER — MORPHINE SULFATE (PF) 2 MG/ML IV SOLN
2.0000 mg | INTRAVENOUS | Status: DC | PRN
  Administered 2022-08-06: 2 mg via INTRAVENOUS
  Filled 2022-08-05: qty 1

## 2022-08-05 MED ORDER — SODIUM CHLORIDE 0.9 % IV SOLN: INTRAVENOUS | Status: DC

## 2022-08-05 MED ORDER — ACETAMINOPHEN 650 MG RE SUPP: 650.0000 mg | Freq: Four times a day (QID) | RECTAL | Status: DC | PRN

## 2022-08-05 MED ORDER — SODIUM CHLORIDE 0.9 % IV BOLUS
1000.0000 mL | Freq: Once | INTRAVENOUS | Status: AC
  Administered 2022-08-05: 1000 mL via INTRAVENOUS

## 2022-08-05 MED ORDER — ONDANSETRON HCL 4 MG/2ML IJ SOLN: 4.0000 mg | Freq: Four times a day (QID) | INTRAMUSCULAR | Status: DC | PRN

## 2022-08-05 MED ORDER — ONDANSETRON HCL 4 MG PO TABS: 4.0000 mg | ORAL_TABLET | Freq: Four times a day (QID) | ORAL | Status: DC | PRN

## 2022-08-05 NOTE — Assessment & Plan Note (Addendum)
Acute pancreatitis initially suspected biliary source given lipase 3418, with  elevated LFTs with AST/ALT 199/144, alk phos 154 and total bilirubin 1.4 CT abdomen and pelvis and right upper quadrant ultrasound with no evidence for cholelithiasis.  Did show fatty infiltration of the liver. MRCP showed edematous gallbladder wall thickening without etiology by MR imaging. No cholelithiasis. No intra or extrahepatic biliary duct dilatation. No choledocholithiasis. Patient denies alcohol use/abuse ??  Medication induced Liver enzymes show a downward trend and patient's symptoms have resolved. She has been able to tolerate a diet and will be discharged home

## 2022-08-05 NOTE — Assessment & Plan Note (Addendum)
Continue risperidone, Zyprexa, mirtazapine and duloxetine

## 2022-08-05 NOTE — H&P (Signed)
History and Physical    Patient: Erica Wiggins OMV:[id number] DOB: 1971/02/16 DOA: 08/05/2022 DOS: the patient was seen and examined on 08/05/2022 [facility]: [facility]  Patient coming from: Home  Chief Complaint:  Chief Complaint  Patient presents with   Chest Pain    HPI: Erica Wiggins is a 51 y.o. female with medical history significant for Generalized anxiety disorder who presents to the ED with a 1 day history of upper abdominal pain radiating from right to left.  She vomited once yesterday that was non bloody and non bilious and had a bowel movement with clay colored stool. Denies dysuria, fever or chills.  Pain is colicky,nonexertional and associated with abdominal bloating and nausea. Had some greenish liquid stool just before coming in. ED course and data review: On arrival afebrile, pulse 88 and BP 169/102 Labs WBC 8600 with otherwise normal CBC.  BMP unremarkable.  Lipase elevated at 3418 with elevated LFTs with AST/ALT 199/144, alk phos 154 and total bilirubin 1.4.  Troponin 5 EKG personally viewed and interpreted showing NSR at 90 with no acute ST-T wave changes   CT abdomen and pelvis shows the following: IMPRESSION: Distended gallbladder with mild diffuse wall thickening, but no definite gallstones visualized by CT. Acute cholecystitis cannot be excluded. Consider abdomen ultrasound or hepatobiliary scan for further evaluation.   Small umbilical hernia, which contains only fat.  Right upper quadrant ultrasound shows the following: IMPRESSION: Gallbladder wall thickening similar to that seen on prior CT. No cholelithiasis is noted. Negative sonographic Murphy's sign is elicited. HIDA scan may be helpful for further evaluation as necessary.   Fatty infiltration of the liver.  Patient was placed n.p.o. and started on Zosyn The ED provider spoke with gastroenterologist, Dr. Allen Norris who recommended MRCP and based on result will arrange for ERCP.  Hospitalist consulted for admission.        Past Medical History:  Diagnosis Date   Stress    Past Surgical History:  Procedure Laterality Date   FEMUR SURGERY     while in the service   HIP SURGERY     occured while in service   Social History:  reports that she has been smoking cigarettes. She has never used smokeless tobacco. She reports that she does not drink alcohol and does not use drugs.  No Known Allergies  History reviewed. No pertinent family history.  Prior to Admission medications   Medication Sig Start Date End Date Taking? Authorizing Provider  acetaminophen (TYLENOL) 500 MG tablet Take 1,000 mg by mouth every 6 (six) hours as needed for fever.    [provider]  diphenoxylate-atropine (LOMOTIL) 2.5-0.025 MG per tablet Take 2 tablets by mouth 4 (four) times daily as needed for diarrhea or loose stools. 04/15/15   Orpah Greek, MD  DULoxetine (CYMBALTA) 60 MG capsule Take 1 capsule (60 mg total) by mouth daily. 08/16/16 08/16/17  Menshew, Dannielle Karvonen, PA-C  ferrous sulfate 325 (65 FE) MG tablet Take 325 mg by mouth daily. 02/21/15 02/21/16  [provider]  lactulose (CEPHULAC) 20 g packet Take 1 packet (20 g total) by mouth 3 (three) times daily. 04/14/18   Merlyn Lot, MD  meloxicam (MOBIC) 7.5 MG tablet Take 1 tablet (7.5 mg total) by mouth daily. 07/06/22   Tacy Learn, PA-C  ondansetron (ZOFRAN) 4 MG tablet Take 1 tablet (4 mg total) by mouth every 6 (six) hours. 04/15/15   Orpah Greek, MD  traZODone (DESYREL) 50 MG tablet Take 1 tablet (  50 mg total) by mouth at bedtime. 08/16/16   Menshew, Dannielle Karvonen, PA-C    Physical Exam: Vitals:   08/05/22 1520 08/05/22 1546 08/05/22 1846 08/05/22 1900  BP: (!) 169/102 (!) 160/100 (!) 150/84 (!) 138/90  Pulse: 88 86 86 72  Resp: 20 (!) _0 Temp: 98.7 F (37.1 C) 98.1 F (36.7 C) 98 F (36.7 C)   TempSrc: Oral Oral Oral   SpO2: 99% 99% 94% 100%  Weight:      Height:       Physical Exam Vitals and  nursing note reviewed.  Constitutional:      General: She is not in acute distress. HENT:     Head: Normocephalic and atraumatic.  Cardiovascular:     Rate and Rhythm: Normal rate and regular rhythm.     Heart sounds: Normal heart sounds.  Pulmonary:     Effort: Pulmonary effort is normal.     Breath sounds: Normal breath sounds.  Abdominal:     General: There is no abdominal bruit.     Palpations: Abdomen is soft.     Tenderness: There is abdominal tenderness in the right upper quadrant and epigastric area.  Neurological:     Mental Status: Mental status is at baseline.     Labs on Admission: I have personally reviewed following labs and imaging studies  CBC: Recent Labs  Lab 08/05/22 1528  WBC 8.6  HGB 11.8*  HCT 36.1  MCV 90.9  PLT 223   Basic Metabolic Panel: Recent Labs  Lab 08/05/22 1528  NA 137  K 3.5  CL 105  CO2 25  GLUCOSE 130*  BUN 11  CREATININE 0.82  CALCIUM 9.4   GFR: Estimated Creatinine Clearance: 94.9 mL/min (by C-G formula based on SCr of 0.82 mg/dL). Liver Function Tests: Recent Labs  Lab 08/05/22 1659  AST 199*  ALT 144*  ALKPHOS 157*  BILITOT 1.4*  PROT 9.0*  ALBUMIN 4.7   Recent Labs  Lab 08/05/22 1659  LIPASE 3,418*   No results for input(s): "AMMONIA" in the last 168 hours. Coagulation Profile: No results for input(s): "INR", "PROTIME" in the last 168 hours. Cardiac Enzymes: No results for input(s): "CKTOTAL", "CKMB", "CKMBINDEX", "TROPONINI" in the last 168 hours. BNP (last 3 results) No results for input(s): "PROBNP" in the last 8760 hours. HbA1C: No results for input(s): "HGBA1C" in the last 72 hours. CBG: No results for input(s): "GLUCAP" in the last 168 hours. Lipid Profile: No results for input(s): "CHOL", "HDL", "LDLCALC", "TRIG", "CHOLHDL", "LDLDIRECT" in the last 72 hours. Thyroid Function Tests: No results for input(s): "TSH", "T4TOTAL", "FREET4", "T3FREE", "THYROIDAB" in the last 72 hours. Anemia  Panel: No results for input(s): "VITAMINB12", "FOLATE", "FERRITIN", "TIBC", "IRON", "RETICCTPCT" in the last 72 hours. Urine analysis:    Component Value Date/Time   COLORURINE YELLOW (A) 04/14/2018 1929   APPEARANCEUR HAZY (A) 04/14/2018 1929   LABSPEC 1.025 04/14/2018 1929   PHURINE 5.0 04/14/2018 1929   GLUCOSEU NEGATIVE 04/14/2018 1929   HGBUR SMALL (A) 04/14/2018 [place] NEGATIVE 04/14/2018 [place] NEGATIVE 04/14/2018 [place] NEGATIVE 04/14/2018 1929   UROBILINOGEN 1.0 04/15/2015 1113   NITRITE NEGATIVE 04/14/2018 1929   LEUKOCYTESUR NEGATIVE 04/14/2018 1929    Radiological Exams on Admission: US Abdomen Limited RUQ (LIVER/GB)  Result Date: 08/05/2022 CLINICAL DATA:  Right upper quadrant pain for 2 days EXAM: ULTRASOUND ABDOMEN LIMITED RIGHT UPPER QUADRANT COMPARISON:  CT from earlier in the same  day. FINDINGS: Gallbladder: Gallbladder is well distended with thickening of the gallbladder wall to 4.8 mm. Negative sonographic Murphy's sign is noted however. No cholelithiasis is seen. Common bile duct: Diameter: 4.7 mm. Liver: Increased in echogenicity consistent with fatty infiltration. Portal vein is patent on color Doppler imaging with normal direction of blood flow towards the liver. Other: None. IMPRESSION: Gallbladder wall thickening similar to that seen on prior CT. No cholelithiasis is noted. Negative sonographic Murphy's sign is elicited. HIDA scan may be helpful for further evaluation as necessary. Fatty infiltration of the liver. Electronically Signed   By: Inez Catalina M.D.   On: 08/05/2022 19:27   CT ABDOMEN PELVIS W CONTRAST  Result Date: 08/05/2022 CLINICAL DATA:  Abdominal pain, worst in epigastric region, beginning last night. Constipation. EXAM: CT ABDOMEN AND PELVIS WITH CONTRAST TECHNIQUE: Multidetector CT imaging of the abdomen and pelvis was performed using the standard protocol following bolus administration of intravenous contrast.  RADIATION DOSE REDUCTION: This exam was performed according to the departmental dose-optimization program which includes automated exposure control, adjustment of the mA and/or kV according to patient size and/or use of iterative reconstruction technique. CONTRAST:  166m OMNIPAQUE IOHEXOL 300 MG/ML  SOLN COMPARISON:  None Available. FINDINGS: Lower Chest: No acute findings. Hepatobiliary: No hepatic masses identified. Gallbladder is distended and mild diffuse wall thickening is seen, although no definite gallstones are visualized by CT. No evidence of biliary ductal dilatation. Pancreas:  No mass or inflammatory changes. Spleen: Within normal limits in size and appearance. Adrenals/Urinary Tract: No masses identified. No evidence of ureteral calculi or hydronephrosis. Stomach/Bowel: No evidence of obstruction, inflammatory process or abnormal fluid collections. Vascular/Lymphatic: No pathologically enlarged lymph nodes. No acute vascular findings. Reproductive: No mass or other significant abnormality. Beam hardening artifact through the lower pelvis from left hip prosthesis. Other:  Small umbilical hernia is seen, which contains only fat. Musculoskeletal: No suspicious bone lesions identified. Left hip prosthesis noted. IMPRESSION: Distended gallbladder with mild diffuse wall thickening, but no definite gallstones visualized by CT. Acute cholecystitis cannot be excluded. Consider abdomen ultrasound or hepatobiliary scan for further evaluation. Small umbilical hernia, which contains only fat. Electronically Signed   By: JMarlaine HindM.D.   On: 08/05/2022 17:42   DG Chest 2 View  Result Date: 08/05/2022 CLINICAL DATA:  Substernal and epigastric pain. EXAM: CHEST - 2 VIEW COMPARISON:  None Available. FINDINGS: The heart size and mediastinal contours are within normal limits. Both lungs are clear. The visualized skeletal structures are unremarkable. IMPRESSION: No active cardiopulmonary disease. Electronically  Signed   By: DDorise BullionIII M.D.   On: 08/05/2022 15:49     Data Reviewed: Relevant notes from primary care and specialist visits, past discharge summaries as available in EHR, including Care Everywhere. Prior diagnostic testing as pertinent to current admission diagnoses Updated medications and problem lists for reconciliation ED course, including vitals, labs, imaging, treatment and response to treatment Triage notes, nursing and pharmacy notes and ED provider's notes Notable results as noted in HPI   Assessment and Plan: * Acute biliary pancreatitis Acute pancreatitis, suspected biliary source given lipase 3418, with  elevated LFTs with AST/ALT 199/144, alk phos 154 and total bilirubin 1.4 CT abdomen and pelvis and right upper quadrant ultrasound with no evidence for cholelithiasis.  Did show fatty infiltration of the liver Follow-up MRCP Pain control and antiemetics and IV fluids and supportive care Patient received an empiric dose of Zosyn in the ED.  Not continued at this time Keep n.p.o.  in case of procedure The ED provider spoke with gastroenterologist Dr. Allen Norris and consult has been placed  Generalized anxiety disorder Previously on duloxetine.  Will hold for now        DVT prophylaxis: Eliquis  Consults: GI   Advance Care Planning: full code  Family Communication: none  Disposition Plan: Back to previous home environment  Severity of Illness: The appropriate patient status for this patient is INPATIENT. Inpatient status is judged to be reasonable and necessary in order to provide the required intensity of service to ensure the patient's safety. The patient's presenting symptoms, physical exam findings, and initial radiographic and laboratory data in the context of their chronic comorbidities is felt to place them at high risk for further clinical deterioration. Furthermore, it is not anticipated that the patient will be medically stable for discharge from the  hospital within 2 midnights of admission.   * I certify that at the point of admission it is my clinical judgment that the patient will require inpatient hospital care spanning beyond 2 midnights from the point of admission due to high intensity of service, high risk for further deterioration and high frequency of surveillance required.*  Author: Athena Masse, MD 08/05/2022 8:42 PM  For on call review [email or web address].

## 2022-08-05 NOTE — ED Notes (Signed)
Patient transported to MRI 

## 2022-08-05 NOTE — ED Triage Notes (Signed)
Pt via POV from home. Pt c/o sub sternal to epigastric pain that started last night, states that she thinks it might be from constipation. States that she is also SOB. States the pain is like a pressure. Pt is A&OX4 and NAD.

## 2022-08-05 NOTE — Consult Note (Signed)
Pharmacy Antibiotic Note  Erica Wiggins is a 51 y.o. female admitted on 08/05/2022 with  intra-abdominal infection .  Pharmacy has been consulted for Zosyn dosing.  Plan: Zosyn 3.375g IV q8h (4 hour infusion).  Height: 5\' 5"  (165.1 cm) Weight: 99.8 kg (220 lb) IBW/kg (Calculated) : 57  Temp (24hrs), Avg:98.3 F (36.8 C), Min:98 F (36.7 C), Max:98.7 F (37.1 C)  Recent Labs  Lab 08/05/22 1528  WBC 8.6  CREATININE 0.82    Estimated Creatinine Clearance: 94.9 mL/min (by C-G formula based on SCr of 0.82 mg/dL).    No Known Allergies  Antimicrobials this admission: 8/13 Zosyn >>   Dose adjustments this admission: none   Thank you for allowing pharmacy to be a part of this patient's care.  Janashia Parco Rodriguez-Guzman PharmD, BCPS 08/05/2022 8:34 PM

## 2022-08-05 NOTE — ED Provider Notes (Signed)
[facility] Provider Note    Event Date/Time   First MD Initiated Contact with Patient 08/05/22 1547     (approximate)   History   Chest Pain   HPI  Erica Wiggins is a 51 y.o. female  with PMHx HTN here with abdominal pain. Pt reports that she has had progressively worsening diffuse, primarily epigastric abd pain and discomfort for the past several days. It is markedly worse w/ eating, and palpation. No alleviating factors. No diarrhea. Pt has a long h/o constipation and states she tried to take laxatives earlier without relief, and this did seem to make her pain worse. She then had a large amount of greenish liquid stool.      Physical Exam   Triage Vital Signs: ED Triage Vitals  Enc Vitals Group     BP 08/05/22 1520 (!) 169/102     Pulse Rate 08/05/22 1520 88     Resp 08/05/22 1520 20     Temp 08/05/22 1520 98.7 F (37.1 C)     Temp Source 08/05/22 1520 Oral     SpO2 08/05/22 1520 99 %     Weight 08/05/22 1513 220 lb (99.8 kg)     Height 08/05/22 1513 5\' 5"  (1.651 m)     Head Circumference --      Peak Flow --      Pain Score 08/05/22 1513 10     Pain Loc --      Pain Edu? --      Excl. in GC? --     Most recent vital signs: Vitals:   08/05/22 2327 08/06/22 0015  BP:  133/83  Pulse:  69  Resp:  17  Temp: 98.2 F (36.8 C)   SpO2:  97%     General: Awake, no distress.  CV:  Good peripheral perfusion. RRR. No murmurs, rubs, gallops. Resp:  Normal effort. Lungs CTAB. Abd:  No distention. Epigastric and RUQ TTP. No guarding or rebound. Other:  No LE edema.   ED Results / Procedures / Treatments   Labs (all labs ordered are listed, but only abnormal results are displayed) Labs Reviewed  BASIC METABOLIC PANEL - Abnormal; Notable for the following components:      Result Value   Glucose, Bld 130 (*)    All other components within normal limits  CBC - Abnormal; Notable for the following components:   Hemoglobin 11.8 (*)    All  other components within normal limits  HEPATIC FUNCTION PANEL - Abnormal; Notable for the following components:   Total Protein 9.0 (*)    AST 199 (*)    ALT 144 (*)    Alkaline Phosphatase 157 (*)    Total Bilirubin 1.4 (*)    Bilirubin, Direct 0.4 (*)    Indirect Bilirubin 1.0 (*)    All other components within normal limits  LIPASE, BLOOD - Abnormal; Notable for the following components:   Lipase 3,418 (*)    All other components within normal limits  SARS CORONAVIRUS 2 BY RT PCR  HIV ANTIBODY (ROUTINE TESTING W REFLEX)  POC URINE PREG, ED  TROPONIN I (HIGH SENSITIVITY)  TROPONIN I (HIGH SENSITIVITY)     EKG Normal sinus rhythm, VR 90.  PR 122, QRS 70, QTc 47.  No acute ST elevations or depressions.  No EKG was of acute ischemia or infarct.   RADIOLOGY Chest x-ray: Negative CT abdomen/pelvis: Distended gallbladder, possible cholecystitis   I also independently reviewed and agree with  radiologist interpretations.   PROCEDURES:  Critical Care performed: No  MEDICATIONS ORDERED IN ED: Medications  piperacillin-tazobactam (ZOSYN) IVPB 3.375 g (0 g Intravenous Stopped 08/05/22 2207)  acetaminophen (TYLENOL) tablet 650 mg (has no administration in time range)    Or  acetaminophen (TYLENOL) suppository 650 mg (has no administration in time range)  ondansetron (ZOFRAN) tablet 4 mg (has no administration in time range)    Or  ondansetron (ZOFRAN) injection 4 mg (has no administration in time range)  lactated ringers infusion ( Intravenous New Bag/Given 08/06/22 0017)  morphine (PF) 2 MG/ML injection 2 mg (has no administration in time range)  iohexol (OMNIPAQUE) 300 MG/ML solution 100 mL (100 mLs Intravenous Contrast Given 08/05/22 1724)  HYDROmorphone (DILAUDID) injection 1 mg (1 mg Intravenous Given 08/05/22 1819)  ondansetron (ZOFRAN) injection 4 mg (4 mg Intravenous Given 08/05/22 1819)  sodium chloride 0.9 % bolus 1,000 mL (0 mLs Intravenous Stopped 08/05/22 1924)   gadobutrol (GADAVIST) 1 MMOL/ML injection 10 mL (10 mLs Intravenous Contrast Given 08/05/22 2126)     IMPRESSION / MDM / ASSESSMENT AND PLAN / ED COURSE  I reviewed the triage vital signs and the nursing notes.                              Ddx:  Differential includes the following, with pertinent life- or limb-threatening emergencies considered:  Pancreatitis, cholecystitis, obstruction, gastritis PUD, volvulus, constipation/impaction, IBS, GERD  Patient's presentation is most consistent with acute presentation with potential threat to life or bodily function.  MDM:  51 yo F with no significant PMHx here with abdominal pain, nausea, vomiting. Initial DDx as above. Lab work remarkable for elevated lipase, transaminitis and Bili of 1.4. Findings concerning for cholangitis/choledocholithiasis. CT obtained initially shows possible GB wall thickening, so U/S obtained which confirms wall thickening but no sludge or stones, and CBD is normal. Pt is afebrile and does not clinically have cholangitis. Discussed with Dr. Servando Snare. Will order MRCP, and admit ot medicine for management of pancreatitis/abd pain.    MEDICATIONS GIVEN IN ED: Medications  piperacillin-tazobactam (ZOSYN) IVPB 3.375 g (0 g Intravenous Stopped 08/05/22 2207)  acetaminophen (TYLENOL) tablet 650 mg (has no administration in time range)    Or  acetaminophen (TYLENOL) suppository 650 mg (has no administration in time range)  ondansetron (ZOFRAN) tablet 4 mg (has no administration in time range)    Or  ondansetron (ZOFRAN) injection 4 mg (has no administration in time range)  lactated ringers infusion ( Intravenous New Bag/Given 08/06/22 0017)  morphine (PF) 2 MG/ML injection 2 mg (has no administration in time range)  iohexol (OMNIPAQUE) 300 MG/ML solution 100 mL (100 mLs Intravenous Contrast Given 08/05/22 1724)  HYDROmorphone (DILAUDID) injection 1 mg (1 mg Intravenous Given 08/05/22 1819)  ondansetron (ZOFRAN) injection 4 mg (4  mg Intravenous Given 08/05/22 1819)  sodium chloride 0.9 % bolus 1,000 mL (0 mLs Intravenous Stopped 08/05/22 1924)  gadobutrol (GADAVIST) 1 MMOL/ML injection 10 mL (10 mLs Intravenous Contrast Given 08/05/22 2126)     Consults:  Gastroenterology Case discussed with Dr. Servando Snare, agrees with plan for MRCP. Will f/u pt in AM - does not need to be contacted tonight.    EMR reviewed       FINAL CLINICAL IMPRESSION(S) / ED DIAGNOSES   Final diagnoses:  Acute pancreatitis without infection or necrosis, unspecified pancreatitis type  Transaminitis     Rx / DC Orders   ED Discharge Orders  None        Note:  This document was prepared using Dragon voice recognition software and may include unintentional dictation errors.   Shaune Pollack, MD 08/06/22 [id number]

## 2022-08-06 DIAGNOSIS — F411 Generalized anxiety disorder: Secondary | ICD-10-CM

## 2022-08-06 DIAGNOSIS — I10 Essential (primary) hypertension: Secondary | ICD-10-CM

## 2022-08-06 DIAGNOSIS — K851 Biliary acute pancreatitis without necrosis or infection: Secondary | ICD-10-CM

## 2022-08-06 DIAGNOSIS — K810 Acute cholecystitis: Secondary | ICD-10-CM | POA: Diagnosis present

## 2022-08-06 LAB — COMPREHENSIVE METABOLIC PANEL
ALT: 106 U/L — ABNORMAL HIGH (ref 0–44)
AST: 131 U/L — ABNORMAL HIGH (ref 15–41)
Albumin: 3.6 g/dL (ref 3.5–5.0)
Alkaline Phosphatase: 126 U/L (ref 38–126)
Anion gap: 6 (ref 5–15)
BUN: 11 mg/dL (ref 6–20)
CO2: 22 mmol/L (ref 22–32)
Calcium: 8.4 mg/dL — ABNORMAL LOW (ref 8.9–10.3)
Chloride: 108 mmol/L (ref 98–111)
Creatinine, Ser: 0.69 mg/dL (ref 0.44–1.00)
GFR, Estimated: >60 mL/min (ref >60)
Glucose, Bld: 89 mg/dL (ref 70–99)
Potassium: 3.7 mmol/L (ref 3.5–5.1)
Sodium: 136 mmol/L (ref 135–145)
Total Bilirubin: 1.2 mg/dL (ref 0.3–1.2)
Total Protein: 6.9 g/dL (ref 6.5–8.1)

## 2022-08-06 LAB — LIPASE, BLOOD: Lipase: 544 U/L — ABNORMAL HIGH (ref 11–51)

## 2022-08-06 LAB — HIV ANTIBODY (ROUTINE TESTING W REFLEX): HIV Screen 4th Generation wRfx: NONREACTIVE

## 2022-08-06 MED ORDER — AMLODIPINE BESYLATE 5 MG PO TABS
5.0000 mg | ORAL_TABLET | Freq: Every day | ORAL | Status: DC
  Administered 2022-08-06 – 2022-08-07 (×2): 5 mg via ORAL
  Filled 2022-08-06 (×2): qty 1

## 2022-08-06 MED ORDER — DULOXETINE HCL 30 MG PO CPEP
60.0000 mg | ORAL_CAPSULE | Freq: Every day | ORAL | Status: DC
  Administered 2022-08-06: 60 mg via ORAL
  Filled 2022-08-06: qty 1

## 2022-08-06 MED ORDER — MIRTAZAPINE 15 MG PO TABS: 30.0000 mg | ORAL_TABLET | Freq: Every day | ORAL | Status: DC

## 2022-08-06 MED ORDER — OLANZAPINE 10 MG PO TABS: 10.0000 mg | ORAL_TABLET | Freq: Every day | ORAL | Status: DC

## 2022-08-06 MED ORDER — RISPERIDONE 1 MG PO TABS
1.0000 mg | ORAL_TABLET | Freq: Every day | ORAL | Status: DC
  Administered 2022-08-06: 1 mg via ORAL
  Filled 2022-08-06: qty 1

## 2022-08-06 NOTE — Progress Notes (Signed)
Progress Note   Patient: Brandilyn Nanninga OMV:[id number] DOB: 26-Mar-1971 DOA: 08/05/2022     1 DOS: the patient was seen and examined on 08/06/2022   Brief hospital course: 08/14 - Patient admitted to the hospital for acute pancreatitis initially thought to be secondary to biliary pancreatitis.  Gallbladder ultrasound, CT abdomen and pelvis as well as MRCP did not show any evidence of cholelithiasis or choledocholithiasis.  Assessment and Plan: * Acute biliary pancreatitis Acute pancreatitis initially suspected biliary source given lipase 3418, with  elevated LFTs with AST/ALT 199/144, alk phos 154 and total bilirubin 1.4 CT abdomen and pelvis and right upper quadrant ultrasound with no evidence for cholelithiasis.  Did show fatty infiltration of the liver MRCP showed edematous gallbladder wall thickening without etiology by MR imaging. No cholelithiasis. No intra or extrahepatic biliary duct dilatation. No choledocholithiasis. Patient denies alcohol use/abuse Supportive care with pain control, antiemetics and IV fluids. Trial of clears since patient's abdominal pain has improved  Acute cholecystitis Patient is afebrile and has no leukocytosis Imaging shows gallbladder wall thickening similar to that seen on prior CT. No cholelithiasis is noted. Negative sonographic Murphy's sign is elicited.  We will obtain HIDA scan for further evaluation We will consult surgery if abnormal  Essential hypertension Blood pressure is stable on amlodipine  Generalized anxiety disorder Continue risperidone, Zyprexa, mirtazapine and duloxetine        Subjective: Patient is seen and examined at bedside.  Abdominal pain has improved and she is less nauseous this morning.  Physical Exam: Vitals:   08/06/22 0800 08/06/22 0957 08/06/22 1155 08/06/22 1215  BP:  (!) 125/99 105/64 116/74  Pulse: 68 78 72 72  Resp: $Remo'15 15 15 18  'arRQG$ Temp:  98 F (36.7 C) 98 F (36.7 C) 98.6 F (37 C)  TempSrc:  Oral Oral Oral   SpO2: 97% 100% 100% 99%  Weight:      Height:       Physical Exam Vitals and nursing note reviewed.  Constitutional:      Appearance: She is well-developed.  HENT:     Head: Normocephalic and atraumatic.  Cardiovascular:     Rate and Rhythm: Normal rate and regular rhythm.     Heart sounds: Normal heart sounds.  Pulmonary:     Effort: Pulmonary effort is normal.     Breath sounds: Normal breath sounds.  Abdominal:     Palpations: Abdomen is soft.  Musculoskeletal:        General: Normal range of motion.     Cervical back: Normal range of motion and neck supple.  Skin:    General: Skin is warm and dry.  Neurological:     General: No focal deficit present.     Mental Status: She is alert.  Psychiatric:        Mood and Affect: Mood normal.        Behavior: Behavior normal.     Data Reviewed: Relevant notes from primary care and specialist visits, past discharge summaries as available in EHR, including Care Everywhere. Prior diagnostic testing as pertinent to current admission diagnoses Updated medications and problem lists for reconciliation ED course, including vitals, labs, imaging, treatment and response to treatment Triage notes, nursing and pharmacy notes and ED provider's notes Notable results as noted in HPI Labs reviewed.  Lipase level shows a downward trend 3418 >> 544.  Liver enzymes show downward trend as well There are no new results to review at this time.  Family Communication: Greater than 50%  of time was spent discussing patient's condition and plan of care with her at the bedside.  Disposition: Status is: Inpatient Remains inpatient appropriate because: Further evaluation of acute pancreatitis of unclear etiology  Planned Discharge Destination: Home    Time spent: 35 minutes  Author: Collier Bullock, MD 08/06/2022 1:11 PM  For on call review [email or web address].

## 2022-08-06 NOTE — Assessment & Plan Note (Signed)
Blood pressure is stable on amlodipine

## 2022-08-06 NOTE — Progress Notes (Signed)
Mobility Specialist - Progress Note    08/06/22 1400  Mobility  Activity Ambulated independently to bathroom  Level of Assistance Independent  Assistive Device None  Distance Ambulated (ft) 10 ft  Activity Response Tolerated well  $Mobility charge 1 Mobility     Pt semi supine upon arrival using RA. Pt completes all activities indep but just need assistance with equipment management. Pt returns to bed with needs in reach and family at bedside.  Clarisa Schools Mobility Specialist 08/06/22, 2:10 PM

## 2022-08-06 NOTE — Progress Notes (Signed)
Called by the ER physician last night for a possible ERCP due to possible gallstone pancreatitis.  The patient's MRCP did not show any gallstones nor did it show any common bile duct stones.  I have contacted the hospitalist to stated they would hold off on a GI consult for the present time.  Please do not hesitate to call if you have any questions.

## 2022-08-06 NOTE — Assessment & Plan Note (Signed)
Ruled out Patient is afebrile and has no leukocytosis Imaging showed gallbladder wall thickening similar to that seen on prior CT. No cholelithiasis is noted. Negative sonographic Murphy's sign is elicited.  Patient is asymptomatic and has had no further episodes of abdominal pain. Patient had a HIDA scan which showed patent cystic and common bile ducts.  Normal gallbladder ejection fraction.

## 2022-08-07 ENCOUNTER — Inpatient Hospital Stay: Payer: Medicaid Other

## 2022-08-07 DIAGNOSIS — E669 Obesity, unspecified: Secondary | ICD-10-CM

## 2022-08-07 DIAGNOSIS — K859 Acute pancreatitis without necrosis or infection, unspecified: Secondary | ICD-10-CM

## 2022-08-07 DIAGNOSIS — K810 Acute cholecystitis: Secondary | ICD-10-CM

## 2022-08-07 LAB — CBC
HCT: 32.5 % — ABNORMAL LOW (ref 36.0–46.0)
Hemoglobin: 10.7 g/dL — ABNORMAL LOW (ref 12.0–15.0)
MCH: 30.1 pg (ref 26.0–34.0)
MCHC: 32.9 g/dL (ref 30.0–36.0)
MCV: 91.5 fL (ref 80.0–100.0)
Platelets: 260 10*3/uL (ref 150–400)
RBC: 3.55 MIL/uL — ABNORMAL LOW (ref 3.87–5.11)
RDW: 13.6 % (ref 11.5–15.5)
WBC: 7.1 10*3/uL (ref 4.0–10.5)
nRBC: 0 % (ref 0.0–0.2)

## 2022-08-07 LAB — COMPREHENSIVE METABOLIC PANEL
ALT: 72 U/L — ABNORMAL HIGH (ref 0–44)
AST: 52 U/L — ABNORMAL HIGH (ref 15–41)
Albumin: 3.7 g/dL (ref 3.5–5.0)
Alkaline Phosphatase: 117 U/L (ref 38–126)
Anion gap: 6 (ref 5–15)
BUN: 8 mg/dL (ref 6–20)
CO2: 26 mmol/L (ref 22–32)
Calcium: 9.3 mg/dL (ref 8.9–10.3)
Chloride: 106 mmol/L (ref 98–111)
Creatinine, Ser: 0.75 mg/dL (ref 0.44–1.00)
GFR, Estimated: >60 mL/min (ref >60)
Glucose, Bld: 105 mg/dL — ABNORMAL HIGH (ref 70–99)
Potassium: 3.6 mmol/L (ref 3.5–5.1)
Sodium: 138 mmol/L (ref 135–145)
Total Bilirubin: 1 mg/dL (ref 0.3–1.2)
Total Protein: 7.1 g/dL (ref 6.5–8.1)

## 2022-08-07 LAB — LIPASE, BLOOD: Lipase: 67 U/L — ABNORMAL HIGH (ref 11–51)

## 2022-08-07 MED ORDER — TECHNETIUM TC 99M MEBROFENIN IV KIT
4.6900 | PACK | Freq: Once | INTRAVENOUS | Status: AC | PRN
Start: 2022-08-07 — End: 2022-08-07
  Administered 2022-08-07: 4.69 via INTRAVENOUS

## 2022-08-07 NOTE — Plan of Care (Signed)
Avs and education provided. IV removed with no complications. Pt transported by [facility] to main lobby where husband will transport her home.

## 2022-08-07 NOTE — Plan of Care (Signed)
Pt AAOx4, no pain. VS are WNL. Plan for HIDA scan today. Bed is in lowest position, call light within reach. Will continue to monitor.

## 2022-08-07 NOTE — Assessment & Plan Note (Signed)
BMI 36.6 Complicates overall prognosis and care Lifestyle modification and exercise has been discussed with patient in detail 

## 2022-08-07 NOTE — TOC Progression Note (Signed)
Transition of Care [facility]) - Progression Note    Patient Details  Name: Deirdra Heumann MRN: [id number] Date of Birth: 07/27/1971  Transition of Care [facility]) CM/SW Contact  Chapman Fitch, RN Phone Number: 08/07/2022, 11:02 AM  Clinical Narrative:      Transition of Care [facility]) Screening Note   Patient Details  Name: Zuleima Haser Date of Birth: 1971/04/09   Transition of Care [facility]) CM/SW Contact:    Chapman Fitch, RN Phone Number: 08/07/2022, 11:02 AM    Transition of Care Department [facility]) has reviewed patient and no TOC needs have been identified at this time. We will continue to monitor patient advancement through interdisciplinary progression rounds. If new patient transition needs arise, please place a TOC consult.         Expected Discharge Plan and Services                                                 Social Determinants of Health (SDOH) Interventions    Readmission Risk Interventions     No data to display

## 2022-08-07 NOTE — Discharge Summary (Signed)
Physician Discharge Summary   Patient: Erica Wiggins MRN: [id number] DOB: 04-22-1971  Admit date:     08/05/2022  Discharge date: 08/07/22  Discharge Physician: Avarae Zwart   PCP: [facility]   Recommendations at discharge:   Take medications as recommended Keep scheduled follow-up appointment with primary care provider  Discharge Diagnoses: Principal Problem:   Acute pancreatitis Active Problems:   Generalized anxiety disorder   Essential hypertension   Acute cholecystitis   Obesity (BMI 30-39.9)  Resolved Problems:   * No resolved hospital problems. [facility] Course:  Erica Wiggins is a 51 y.o. female with medical history significant for Generalized anxiety disorder who presented to the ED with a 1 day history of upper abdominal pain radiating from right to left.  She vomited once yesterday that was non bloody and non bilious and had a bowel movement with clay colored stool. Denies dysuria, fever or chills.  Pain is colicky,nonexertional and associated with abdominal bloating and nausea. Had some greenish liquid stool just before coming in. ED course and data review: On arrival afebrile, pulse 88 and BP 169/102 Labs WBC 8600 with otherwise normal CBC.  BMP unremarkable.  Lipase elevated at 3418 with elevated LFTs with AST/ALT 199/144, alk phos 154 and total bilirubin 1.4.  Troponin 5 EKG personally viewed and interpreted showing NSR at 90 with no acute ST-T wave changes    CT abdomen and pelvis shows the following: IMPRESSION: Distended gallbladder with mild diffuse wall thickening, but no definite gallstones visualized by CT. Acute cholecystitis cannot be excluded. Consider abdomen ultrasound or hepatobiliary scan for further evaluation.   Small umbilical hernia, which contains only fat.   Right upper quadrant ultrasound shows the following: IMPRESSION: Gallbladder wall thickening similar to that seen on prior CT. No cholelithiasis is noted. Negative sonographic  Murphy's sign is elicited. HIDA scan may be helpful for further evaluation as necessary.   Fatty infiltration of the liver.   Patient was placed n.p.o. and started on Zosyn The ED provider spoke with gastroenterologist, Dr. Allen Norris who recommended MRCP and based on result will arrange for ERCP.  Hospitalist consulted for admission.   Assessment and Plan: * Acute pancreatitis Acute pancreatitis initially suspected biliary source given lipase 3418, with  elevated LFTs with AST/ALT 199/144, alk phos 154 and total bilirubin 1.4 CT abdomen and pelvis and right upper quadrant ultrasound with no evidence for cholelithiasis.  Did show fatty infiltration of the liver. MRCP showed edematous gallbladder wall thickening without etiology by MR imaging. No cholelithiasis. No intra or extrahepatic biliary duct dilatation. No choledocholithiasis. Patient denies alcohol use/abuse ??  Medication induced Liver enzymes show a downward trend and patient's symptoms have resolved. She has been able to tolerate a diet and will be discharged home    Obesity (BMI 30-39.9) BMI 46.8 Complicates overall prognosis and care Lifestyle modification and exercise has been discussed with patient in detail.  Acute cholecystitis Ruled out Patient is afebrile and has no leukocytosis Imaging showed gallbladder wall thickening similar to that seen on prior CT. No cholelithiasis is noted. Negative sonographic Murphy's sign is elicited.  Patient is asymptomatic and has had no further episodes of abdominal pain. Patient had a HIDA scan which showed patent cystic and common bile ducts.  Normal gallbladder ejection fraction.   Essential hypertension Blood pressure is stable on amlodipine  Generalized anxiety disorder Continue risperidone, Zyprexa, mirtazapine and duloxetine         Consultants: None Procedures performed: Pain control, IV fluid hydration Disposition:  Home Diet recommendation:  Discharge Diet Orders  (From admission, onward)     Start     Ordered   08/07/22 0000  Diet - low sodium heart healthy        08/07/22 1405           Regular diet DISCHARGE MEDICATION: Allergies as of 08/07/2022   No Known Allergies      Medication List     STOP taking these medications    atorvastatin 20 MG tablet Commonly known as: LIPITOR   diphenoxylate-atropine 2.5-0.025 MG tablet Commonly known as: Lomotil   docusate sodium 100 MG capsule Commonly known as: COLACE   doxepin 50 MG capsule Commonly known as: SINEQUAN   gabapentin 300 MG capsule Commonly known as: NEURONTIN   Melatonin 10 MG Tabs   nystatin powder Commonly known as: MYCOSTATIN/NYSTOP   risperiDONE 1 MG tablet Commonly known as: RISPERDAL       TAKE these medications    acetaminophen 500 MG tablet Commonly known as: TYLENOL Take 1,000 mg by mouth every 6 (six) hours as needed for fever.   amLODipine 5 MG tablet Commonly known as: NORVASC Take 5 mg by mouth daily.   DULoxetine 60 MG capsule Commonly known as: Cymbalta Take 1 capsule (60 mg total) by mouth daily.   mirtazapine 30 MG tablet Commonly known as: REMERON Take 30 mg by mouth at bedtime. Pt reports increased dose to 60 mg   OLANZapine 10 MG tablet Commonly known as: ZYPREXA Take 10 mg by mouth at bedtime.   traZODone 100 MG tablet Commonly known as: DESYREL Take 100 mg by mouth at bedtime.        Follow-up Information     [facility] Follow up in 1 week(s).   Contact information: [address] [place] [id number] [phone]                Discharge Exam: Danley Danker Weights   08/05/22 1513  Weight: 99.8 kg  Vitals and nursing note reviewed.  Constitutional:      Appearance: She is well-developed.  HENT:     Head: Normocephalic and atraumatic.  Cardiovascular:     Rate and Rhythm: Normal rate and regular rhythm.     Heart sounds: Normal heart sounds.  Pulmonary:     Effort: Pulmonary effort is normal.      Breath sounds: Normal breath sounds.  Abdominal:     Palpations: Abdomen is soft.  Musculoskeletal:        General: Normal range of motion.     Cervical back: Normal range of motion and neck supple.  Skin:    General: Skin is warm and dry.  Neurological:     General: No focal deficit present.     Mental Status: She is alert.  Psychiatric:        Mood and Affect: Mood normal.        Behavior: Behavior normal.      Condition at discharge: good  The results of significant diagnostics from this hospitalization (including imaging, microbiology, ancillary and laboratory) are listed below for reference.   Imaging Studies: NM Hepato W/EF  Result Date: 08/07/2022 CLINICAL DATA:  Biliary dyskinesia EXAM: NUCLEAR MEDICINE HEPATOBILIARY IMAGING WITH GALLBLADDER EF TECHNIQUE: Sequential images of the abdomen were obtained out to 60 minutes following intravenous administration of radiopharmaceutical. After oral ingestion of Ensure, gallbladder ejection fraction was determined. At 60 min, normal ejection fraction is greater than 33%. RADIOPHARMACEUTICALS:  4.69 mCi Tc-20m Choletec  IV COMPARISON:  MRI August 05, 2022 FINDINGS: Prompt uptake and biliary excretion of activity by the liver is seen. Gallbladder activity is visualized, consistent with patency of cystic duct. Biliary activity passes into small bowel, consistent with patent common bile duct. Calculated gallbladder ejection fraction is 81%. (Normal gallbladder ejection fraction with Ensure is greater than 33%.) IMPRESSION: 1.  Patent cystic and common bile ducts. 2.  Normal gallbladder ejection fraction. Electronically Signed   By: Dahlia Bailiff M.D.   On: 08/07/2022 10:51   MR ABDOMEN MRCP W WO CONTAST  Result Date: 08/06/2022 CLINICAL DATA:  Cholelithiasis on ultrasound earlier today. Apparent gallbladder wall thickening on CT and ultrasound earlier today. EXAM: MRI ABDOMEN WITHOUT AND WITH CONTRAST (INCLUDING MRCP) TECHNIQUE:  Multiplanar multisequence MR imaging of the abdomen was performed both before and after the administration of intravenous contrast. Heavily T2-weighted images of the biliary and pancreatic ducts were obtained, and three-dimensional MRCP images were rendered by post processing. CONTRAST:  4m GADAVIST GADOBUTROL 1 MMOL/ML IV SOLN COMPARISON:  Ultrasound and CT scan from earlier today. FINDINGS: Lower chest: Unremarkable. Hepatobiliary: No suspicious focal abnormality within the liver parenchyma. Edematous gallbladder wall thickening noted, measuring up to 10-11 mm. No evidence for cholelithiasis no intra or extrahepatic biliary duct dilatation. No choledocholithiasis. Pancreas: No focal mass lesion. No dilatation of the main duct. No intraparenchymal cyst. No peripancreatic edema. Spleen:  No splenomegaly. No focal mass lesion. Adrenals/Urinary Tract: No adrenal nodule or mass. Kidneys unremarkable. Stomach/Bowel: Stomach is unremarkable. No gastric wall thickening. No evidence of outlet obstruction. Duodenum is normally positioned as is the ligament of Treitz. No small bowel or colonic dilatation within the visualized abdomen. Vascular/Lymphatic: No abdominal aortic aneurysm. There is no gastrohepatic or hepatoduodenal ligament lymphadenopathy. No retroperitoneal or mesenteric lymphadenopathy. Other:  No intraperitoneal free fluid. Musculoskeletal: No focal suspicious marrow enhancement within the visualized bony anatomy. IMPRESSION: Edematous gallbladder wall thickening without etiology by MR imaging. No cholelithiasis. No intra or extrahepatic biliary duct dilatation. No choledocholithiasis. Electronically Signed   By: EMisty StanleyM.D.   On: 08/06/2022 07:45   [place]Abdomen Limited RUQ (LIVER/GB)  Result Date: 08/05/2022 CLINICAL DATA:  Right upper quadrant pain for 2 days EXAM: ULTRASOUND ABDOMEN LIMITED RIGHT UPPER QUADRANT COMPARISON:  CT from earlier in the same day. FINDINGS: Gallbladder: Gallbladder is  well distended with thickening of the gallbladder wall to 4.8 mm. Negative sonographic Murphy's sign is noted however. No cholelithiasis is seen. Common bile duct: Diameter: 4.7 mm. Liver: Increased in echogenicity consistent with fatty infiltration. Portal vein is patent on color Doppler imaging with normal direction of blood flow towards the liver. Other: None. IMPRESSION: Gallbladder wall thickening similar to that seen on prior CT. No cholelithiasis is noted. Negative sonographic Murphy's sign is elicited. HIDA scan may be helpful for further evaluation as necessary. Fatty infiltration of the liver. Electronically Signed   By: MInez CatalinaM.D.   On: 08/05/2022 19:27   CT ABDOMEN PELVIS W CONTRAST  Result Date: 08/05/2022 CLINICAL DATA:  Abdominal pain, worst in epigastric region, beginning last night. Constipation. EXAM: CT ABDOMEN AND PELVIS WITH CONTRAST TECHNIQUE: Multidetector CT imaging of the abdomen and pelvis was performed using the standard protocol following bolus administration of intravenous contrast. RADIATION DOSE REDUCTION: This exam was performed according to the departmental dose-optimization program which includes automated exposure control, adjustment of the mA and/or kV according to patient size and/or use of iterative reconstruction technique. CONTRAST:  1012mOMNIPAQUE IOHEXOL 300 MG/ML  SOLN COMPARISON:  None Available. FINDINGS: Lower Chest: No acute findings. Hepatobiliary: No hepatic masses identified. Gallbladder is distended and mild diffuse wall thickening is seen, although no definite gallstones are visualized by CT. No evidence of biliary ductal dilatation. Pancreas:  No mass or inflammatory changes. Spleen: Within normal limits in size and appearance. Adrenals/Urinary Tract: No masses identified. No evidence of ureteral calculi or hydronephrosis. Stomach/Bowel: No evidence of obstruction, inflammatory process or abnormal fluid collections. Vascular/Lymphatic: No  pathologically enlarged lymph nodes. No acute vascular findings. Reproductive: No mass or other significant abnormality. Beam hardening artifact through the lower pelvis from left hip prosthesis. Other:  Small umbilical hernia is seen, which contains only fat. Musculoskeletal: No suspicious bone lesions identified. Left hip prosthesis noted. IMPRESSION: Distended gallbladder with mild diffuse wall thickening, but no definite gallstones visualized by CT. Acute cholecystitis cannot be excluded. Consider abdomen ultrasound or hepatobiliary scan for further evaluation. Small umbilical hernia, which contains only fat. Electronically Signed   By: Marlaine Hind M.D.   On: 08/05/2022 17:42   DG Chest 2 View  Result Date: 08/05/2022 CLINICAL DATA:  Substernal and epigastric pain. EXAM: CHEST - 2 VIEW COMPARISON:  None Available. FINDINGS: The heart size and mediastinal contours are within normal limits. Both lungs are clear. The visualized skeletal structures are unremarkable. IMPRESSION: No active cardiopulmonary disease. Electronically Signed   By: Dorise Bullion III M.D.   On: 08/05/2022 15:49    Microbiology: Results for orders placed or performed during the hospital encounter of 08/05/22  SARS Coronavirus 2 by RT PCR (hospital order, performed in [facility] hospital lab) *cepheid single result test* Anterior Nasal Swab     Status: None   Collection Time: 08/05/22  8:04 PM   Specimen: Anterior Nasal Swab  Result Value Ref Range Status   SARS Coronavirus 2 by RT PCR NEGATIVE NEGATIVE Final    Comment: (NOTE) SARS-CoV-2 target nucleic acids are NOT DETECTED.  The SARS-CoV-2 RNA is generally detectable in upper and lower respiratory specimens during the acute phase of infection. The lowest concentration of SARS-CoV-2 viral copies this assay can detect is 250 copies / mL. A negative result does not preclude SARS-CoV-2 infection and should not be used as the sole basis for treatment or other patient  management decisions.  A negative result may occur with improper specimen collection / handling, submission of specimen other than nasopharyngeal swab, presence of viral mutation(s) within the areas targeted by this assay, and inadequate number of viral copies (<250 copies / mL). A negative result must be combined with clinical observations, patient history, and epidemiological information.  Fact Sheet for Patients:   https://www.patel.info/  Fact Sheet for Healthcare Providers: https://hall.com/  This test is not yet approved or  cleared by the [place] FDA and has been authorized for detection and/or diagnosis of SARS-CoV-2 by FDA under an Emergency Use Authorization (EUA).  This EUA will remain in effect (meaning this test can be used) for the duration of the COVID-19 declaration under Section 564(b)(1) of the Act, 21 U.S.C. section 360bbb-3(b)(1), unless the authorization is terminated or revoked sooner.  Performed at [facility], [address] [place], [place] [address]     Labs: CBC: Recent Labs  Lab 08/05/22 1528 08/07/22 1042  WBC 8.6 7.1  HGB 11.8* 10.7*  HCT 36.1 32.5*  MCV 90.9 91.5  PLT 275 482   Basic Metabolic Panel: Recent Labs  Lab 08/05/22 1528 08/06/22 0519 08/07/22 1042  NA 137 136 138  K 3.5 3.7  3.6  CL 105 108 106  CO2 _0 GLUCOSE 130* 89 105*  BUN _1 CREATININE 0.82 0.69 0.75  CALCIUM 9.4 8.4* 9.3   Liver Function Tests: Recent Labs  Lab 08/05/22 1659 08/06/22 0519 08/07/22 1042  AST 199* 131* 52*  ALT 144* 106* 72*  ALKPHOS 157* 126 117  BILITOT 1.4* 1.2 1.0  PROT 9.0* 6.9 7.1  ALBUMIN 4.7 3.6 3.7   CBG: No results for input(s): "GLUCAP" in the last 168 hours.  Discharge time spent: greater than 30 minutes.  Signed: Collier Bullock, MD [facility] 08/07/2022
